# Patient Record
Sex: Female | Born: 1970 | Race: White | Hispanic: No | Smoking: Current some day smoker
Health system: Southern US, Community
[De-identification: ages and names within clinical notes are randomized; demographics above are authoritative.]

## PROBLEM LIST (undated history)

## (undated) DIAGNOSIS — B977 Papillomavirus as the cause of diseases classified elsewhere: Secondary | ICD-10-CM

## (undated) DIAGNOSIS — I10 Essential (primary) hypertension: Secondary | ICD-10-CM

## (undated) DIAGNOSIS — M797 Fibromyalgia: Secondary | ICD-10-CM

## (undated) DIAGNOSIS — G8929 Other chronic pain: Secondary | ICD-10-CM

## (undated) HISTORY — DX: Other chronic pain: G89.29

## (undated) HISTORY — PX: BUNIONECTOMY: SHX129

## (undated) HISTORY — DX: Papillomavirus as the cause of diseases classified elsewhere: B97.7

## (undated) HISTORY — PX: CARPAL TUNNEL RELEASE: SHX101

---

## 2001-03-17 ENCOUNTER — Ambulatory Visit (HOSPITAL_COMMUNITY): Admission: RE | Admit: 2001-03-17 | Discharge: 2001-03-17 | Payer: Self-pay | Admitting: Family Medicine

## 2001-03-17 ENCOUNTER — Encounter: Payer: Self-pay | Admitting: Family Medicine

## 2001-03-30 HISTORY — PX: COLPOSCOPY VULVA W/ BIOPSY: SUR282

## 2001-05-12 ENCOUNTER — Encounter: Payer: Self-pay | Admitting: Family Medicine

## 2001-05-12 ENCOUNTER — Ambulatory Visit (HOSPITAL_COMMUNITY): Admission: RE | Admit: 2001-05-12 | Discharge: 2001-05-12 | Payer: Self-pay | Admitting: Family Medicine

## 2001-11-03 ENCOUNTER — Other Ambulatory Visit: Admission: RE | Admit: 2001-11-03 | Discharge: 2001-11-03 | Payer: Self-pay | Admitting: *Deleted

## 2002-02-03 ENCOUNTER — Encounter: Payer: Self-pay | Admitting: Family Medicine

## 2002-02-03 ENCOUNTER — Ambulatory Visit (HOSPITAL_COMMUNITY): Admission: RE | Admit: 2002-02-03 | Discharge: 2002-02-03 | Payer: Self-pay | Admitting: Family Medicine

## 2002-04-11 ENCOUNTER — Ambulatory Visit (HOSPITAL_COMMUNITY): Admission: RE | Admit: 2002-04-11 | Discharge: 2002-04-11 | Payer: Self-pay | Admitting: Orthopaedic Surgery

## 2009-05-13 ENCOUNTER — Ambulatory Visit (HOSPITAL_COMMUNITY): Admission: RE | Admit: 2009-05-13 | Discharge: 2009-05-13 | Payer: Self-pay | Admitting: Family Medicine

## 2010-05-28 ENCOUNTER — Other Ambulatory Visit (HOSPITAL_COMMUNITY): Payer: Self-pay | Admitting: Family Medicine

## 2010-05-28 DIAGNOSIS — Z139 Encounter for screening, unspecified: Secondary | ICD-10-CM

## 2010-06-03 ENCOUNTER — Other Ambulatory Visit (HOSPITAL_COMMUNITY): Payer: Self-pay | Admitting: Family Medicine

## 2010-06-03 ENCOUNTER — Ambulatory Visit (HOSPITAL_COMMUNITY)
Admission: RE | Admit: 2010-06-03 | Discharge: 2010-06-03 | Disposition: A | Discharge status: Home / Self Care | Payer: BC Managed Care – PPO | Source: Ambulatory Visit | Attending: Family Medicine | Admitting: Family Medicine

## 2010-06-03 DIAGNOSIS — M25561 Pain in right knee: Secondary | ICD-10-CM

## 2010-06-03 DIAGNOSIS — Z1231 Encounter for screening mammogram for malignant neoplasm of breast: Secondary | ICD-10-CM | POA: Insufficient documentation

## 2010-06-03 DIAGNOSIS — Z139 Encounter for screening, unspecified: Secondary | ICD-10-CM

## 2010-08-15 NOTE — Op Note (Signed)
NAME:  [name], [name]                          ACCOUNT NO.:  [id number]   MEDICAL RECORD NO.:  [id number]                   PATIENT TYPE:  AMB   LOCATION:  DAY                                  FACILITY:  [facility]   PHYSICIAN:  J. Darreld Mclean, M.D.              DATE OF BIRTH:  [birth year]   DATE OF PROCEDURE:  DATE OF DISCHARGE:                                 OPERATIVE REPORT   PREOPERATIVE DIAGNOSIS:  Carpal tunnel syndrome right.   POSTOPERATIVE DIAGNOSIS:  Carpal tunnel syndrome right.   PROCEDURE:  1. Release of ulnar carpal tunnel ligament, saline neurolysis median nerve,     right.  2. Release carpal tunnel right.   SURGEON:  J. Darreld Mclean, M.D.   ASSISTANT:  Candace Cruise, P.A.C.   ANESTHESIA:  Bier block   TOURNIQUET TIME:  Please refer to anesthesia record for tourniquet time   DRAINS:  None.   SPLINT:  Volar plaster splint applied.   INDICATIONS:  This patient is a 40 year old female with pain and tenderness  in her right hand.  Nerve conduction EMGs show carpal tunnel syndrome  bilaterally.  Right hand hurts more than the left.  The risks and  imponderables were discussed with the patient preoperatively.  She appears  to understand and agreed to the procedure as outlined   DESCRIPTION OF PROCEDURE:  The patient was placed supine on the operating  room table and appropriate Bier block anesthesia was given.  The patient  prepped and draped in the usual manner.  A right paramedian incision was  made.  After careful dissection, the median nerve was identified proximally  and vessel loop placed around the nerve.  A grove retractor was then placed  in the carpal tunnel space and the volar carpal ligament was then incised.  There was obvious compression to the median nerve.  Specimen of volar carpal  ligament sent to pathology.  Retinaculum cut proximally and at the skin.   Saline neurolysis aponeurotomy carried out. Nerve was inspected, no apparent  injury.  The  wound was reapproximated with 3-0 Nylon in interrupted vertical  mattress manner.  Sterile dressing applied.  Sheet cotton applied.  Sheet  cotton cut dorsally.  Volar plaster splint applied.  Ace bandage applied  loosely.  Tourniquet deflated  in the usual fashion after Bier block anesthesia.  The patient given  appropriate analgesia for pain.  I will see the patient in the office in  approximately 10 days to 2 weeks; if any difficulty, she is to contact me at  the office or the hospital beeper system.  Numbers have been provided.                                               Teola Bradley, M.D.  JWK/MEDQ  D:  04/11/2002  T:  04/11/2002  Job:  [id number]

## 2010-08-15 NOTE — H&P (Signed)
NAMEALANNIS, [name]                            ACCOUNT NO.:  [id number]   MEDICAL RECORD NO.:  [id number]                  PATIENT TYPE:   LOCATION:                                       FACILITY:   PHYSICIAN:  J. Darreld Mclean, M.D.              DATE OF BIRTH:   DATE OF ADMISSION:  DATE OF DISCHARGE:                                HISTORY & PHYSICAL   CHIEF COMPLAINT:  Carpal tunnel syndrome.   HISTORY OF PRESENT ILLNESS:  The patient is a 40 year old female with  significant pain and tenderness in both hands that had been present for  several months.  She has had chronic neck pain and hand pain.  I first saw  her July 18 after Dr. Gerda Diss asked [place] to see her.  By that time she had  carpal tunnel syndrome bilaterally as well as chronic neck pain.  She  underwent EMG with nerve conduction velocities by Dr. Neale Burly at [facility] showing an abnormal study with electrophysiological evidence  of bilateral median nerve mononeuropathies consistent with bilateral carpal  tunnel syndrome.  The patient was advised of the findings and had splints,  rest, anti-inflammatories, been out of work, and she still has the problem.  We had her out of work for several months.  She went back to work; the pain  has reoccurred.  It is fairly significant, rather severe, and she wants to  go ahead and have the surgery done.  She wants to have the right hand done  first because that hurts her the most at the present time.  She will have  the right hand done first and then see how she does before she considers  doing the left hand.   There is no history of trauma to the hand and wrist and imponderables have  been discussed with the patient preoperatively.  She appears to understand  the procedure.   PAST MEDICAL HISTORY:  The patient has a history of hypertension.  Denies  any other significant medical problems.   ALLERGIES:  NAPROSYN and LODINE.   CURRENT MEDICATIONS:  1. Flexeril 10  mg daily.  2. Xanax 0.5 mg one to two daily.  3. Vicodin 5/500 three to four times a day as needed for pain.   SOCIAL HISTORY:  She smokes.  She does not use alcoholic beverages.  She has  her GED degree.   PRIMARY CARE PHYSICIAN:  Dr. Lubertha South is her family doctor.   PREVIOUS SURGERY:  Includes foot surgery in February 2003; breast surgery  2002; another foot surgery in 2001.   FAMILY HISTORY:  Her mother and father both have heart problems; she has  none.   PHYSICAL EXAMINATION:  VITAL SIGNS:  Blood pressure 112/64, pulse 64,  respirations 18, afebrile.  Height 5 feet 2 inches, weight 110.  GENERAL:  She is alert, cooperative, and oriented.  HEENT:  Negative.  NECK:  Supple.  LUNGS:  Clear to P&A.  HEART:  Regular rhythm without murmur.  ABDOMEN:  Soft, nontender, without masses.  EXTREMITIES:  Positive Phalen's, positive Tinel's both hands - right more  painful than the left.  NEUROLOGIC:  No deficits noted.  SKIN:  Intact.   IMPRESSION:  Bilateral carpal tunnel syndrome, right greater than left.   PLAN:  Carpal tunnel release on the right.  Again, I discussed risk and  imponderables with the patient who understands she may not get complete  immediate return of all sensation to her fingers, but should decrease her  pain.  Bier block anesthesia was discussed.  This is an outpatient  procedure; labs are pending.                                               Teola Bradley, M.D.    JWK/MEDQ  D:  04/06/2002  T:  04/06/2002  Job:  [id number]

## 2012-06-20 ENCOUNTER — Encounter: Payer: Self-pay | Admitting: Nurse Practitioner

## 2012-06-20 ENCOUNTER — Ambulatory Visit: Payer: Self-pay | Admitting: Nurse Practitioner

## 2012-06-20 ENCOUNTER — Encounter: Payer: Self-pay | Admitting: Family Medicine

## 2012-06-20 VITALS — BP 160/102 | HR 80 | Wt 134.0 lb

## 2012-06-20 DIAGNOSIS — T192XXA Foreign body in vulva and vagina, initial encounter: Secondary | ICD-10-CM

## 2012-06-20 DIAGNOSIS — N760 Acute vaginitis: Secondary | ICD-10-CM

## 2012-06-20 DIAGNOSIS — W448XXA Other foreign body entering into or through a natural orifice, initial encounter: Secondary | ICD-10-CM | POA: Insufficient documentation

## 2012-06-20 MED ORDER — DOXYCYCLINE HYCLATE 100 MG PO TBEC: 100.0000 mg | DELAYED_RELEASE_TABLET | Freq: Two times a day (BID) | ORAL | Status: DC

## 2012-06-20 MED ORDER — CEFTRIAXONE SODIUM 1 G IJ SOLR
250.0000 mg | Freq: Once | INTRAMUSCULAR | Status: AC
  Administered 2012-06-20: 250 mg via INTRAMUSCULAR

## 2012-06-20 MED ORDER — METRONIDAZOLE 500 MG PO TABS: ORAL_TABLET | ORAL | Status: AC

## 2012-06-20 MED ORDER — CIPROFLOXACIN HCL 500 MG PO TABS: 500.0000 mg | ORAL_TABLET | Freq: Two times a day (BID) | ORAL | Status: DC

## 2012-06-20 NOTE — Assessment & Plan Note (Addendum)
Assessment: retained tampon removed with secondary vaginitis  Plan: Rocephin 250 mg IM now.  Prescribed doxycycline and Flagyl.     Call back if any fever, rash, swelling, redness of the skin or pelvic pain.   Call back in 72 hours if no improvement, sooner if worse. Check BP outside office.  Recommend OV in near future for recheck.

## 2012-06-20 NOTE — Progress Notes (Signed)
Subjective:  Presents for c/o bad vaginal odor for past several days.  Her sexual partner noticed a foreign body in vagina during intercourse this weekend.  Thinks tampon has been in there for about a week.  No fever, rash, erythema or pelvic pain.  The only symptom she has noted is odor.  Objective:  NAD. Alert, oriented.  Lungs clear.  Heart RRR.  Abd soft, nontender. EGBUS nl. Vagina:  Moderate amt. Brown foul smelling discharge.  Tampon removed from area of lower cervix.  Wedged in under cervix.  No CMT.  Bimanuel exam nl.

## 2012-06-20 NOTE — Patient Instructions (Addendum)
You should have 2 antibiotics at pharmacy.  Doxycyline and Metronidazole.  Call back if any fever, rash, swelling, redness of the skin or pelvic pain.

## 2012-07-13 ENCOUNTER — Encounter: Payer: Self-pay | Admitting: *Deleted

## 2012-07-15 ENCOUNTER — Ambulatory Visit (INDEPENDENT_AMBULATORY_CARE_PROVIDER_SITE_OTHER): Payer: BC Managed Care – PPO | Admitting: Nurse Practitioner

## 2012-07-15 ENCOUNTER — Encounter: Payer: Self-pay | Admitting: Nurse Practitioner

## 2012-07-15 VITALS — BP 178/108 | HR 70 | Ht 60.0 in | Wt 137.4 lb

## 2012-07-15 DIAGNOSIS — I10 Essential (primary) hypertension: Secondary | ICD-10-CM

## 2012-07-15 DIAGNOSIS — R5383 Other fatigue: Secondary | ICD-10-CM

## 2012-07-15 DIAGNOSIS — Z13220 Encounter for screening for lipoid disorders: Secondary | ICD-10-CM

## 2012-07-15 DIAGNOSIS — R5381 Other malaise: Secondary | ICD-10-CM

## 2012-07-15 MED ORDER — LISINOPRIL-HYDROCHLOROTHIAZIDE 10-12.5 MG PO TABS: 1.0000 | ORAL_TABLET | Freq: Every day | ORAL | Status: DC

## 2012-07-15 NOTE — Patient Instructions (Signed)

## 2012-07-17 ENCOUNTER — Encounter: Payer: Self-pay | Admitting: Nurse Practitioner

## 2012-07-17 DIAGNOSIS — I10 Essential (primary) hypertension: Secondary | ICD-10-CM | POA: Insufficient documentation

## 2012-07-17 NOTE — Progress Notes (Signed)
Subjective:  Presents for complaints of elevated blood pressure for the past couple of months. 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 Her recent ECG in [place] which was normal. High stress job. Otherwise stress level has been normal. Has an active job requiring a lot of walking. Does have some slight swelling in her legs at the end of her shift. No orthopnea. No chest pain/ischemic type pain or shortness of breath. No OTC meds or supplements. Does have extra sodium in her diet. Some fatigue. Strong family history of hypertension. Patient stopped smoking several months ago.  Objective:   BP 178/108  Pulse 70  Ht 5' (1.524 m)  Wt 137 lb 6.4 oz (62.324 kg)  BMI 26.83 kg/m2  LMP 06/15/2012 NAD. Alert, oriented. Lungs clear. Heart regular rate rhythm. No murmur or gallop noted. BP on recheck left arm sitting 178/108. Lower extremities no edema.   Assessment: #1 Hypertension new diagnosis #2 fatigue  Plan: Zestoretic 10/12.5 daily. Discussed lifestyle factors affecting her pressure. Encourage stress reduction and low-sodium diet. Also regular exercise.  Meds ordered this encounter  Medications  . lisinopril-hydrochlorothiazide (PRINZIDE,ZESTORETIC) 10-12.5 MG per tablet    Sig: Take 1 tablet by mouth daily.    Dispense:  90 tablet    Refill:  1    Order Specific Question:  Supervising Provider    Answer:  Merlyn Albert [[id number]]   Orders Placed This Encounter  Procedures  . CBC with Differential    Standing Status: Future     Number of Occurrences: 1     Standing Expiration Date: 07/15/2013  . Basic metabolic panel    Standing Status: Future     Number of Occurrences: 1     Standing Expiration Date: 07/15/2013    Order Specific Question:  Has the patient fasted?    Answer:  Yes  . Hepatic function panel    Standing Status: Future     Number of Occurrences: 1     Standing Expiration Date: 07/15/2013  . Lipid panel    Standing  Status: Future     Number of Occurrences: 1     Standing Expiration Date: 07/15/2013    Order Specific Question:  Has the patient fasted?    Answer:  Yes  . TSH    Standing Status: Future     Number of Occurrences: 1     Standing Expiration Date: 07/15/2013  . Vitamin D 25 hydroxy    Standing Status: Future     Number of Occurrences: 1     Standing Expiration Date: 07/15/2013   Recheck in one month, call back sooner if any problems. Reviewed potential problems associated with uncontrolled hypertension.

## 2012-07-17 NOTE — Assessment & Plan Note (Signed)
Meds ordered this encounter  Medications  . lisinopril-hydrochlorothiazide (PRINZIDE,ZESTORETIC) 10-12.5 MG per tablet    Sig: Take 1 tablet by mouth daily.    Dispense:  90 tablet    Refill:  1    Order Specific Question:  Supervising Provider    Answer:  Merlyn Albert [[id number]]   Orders Placed This Encounter  Procedures  . CBC with Differential    Standing Status: Future     Number of Occurrences: 1     Standing Expiration Date: 07/15/2013  . Basic metabolic panel    Standing Status: Future     Number of Occurrences: 1     Standing Expiration Date: 07/15/2013    Order Specific Question:  Has the patient fasted?    Answer:  Yes  . Hepatic function panel    Standing Status: Future     Number of Occurrences: 1     Standing Expiration Date: 07/15/2013  . Lipid panel    Standing Status: Future     Number of Occurrences: 1     Standing Expiration Date: 07/15/2013    Order Specific Question:  Has the patient fasted?    Answer:  Yes  . TSH    Standing Status: Future     Number of Occurrences: 1     Standing Expiration Date: 07/15/2013  . Vitamin D 25 hydroxy    Standing Status: Future     Number of Occurrences: 1     Standing Expiration Date: 07/15/2013

## 2012-07-18 ENCOUNTER — Ambulatory Visit: Payer: Self-pay | Admitting: Nurse Practitioner

## 2012-07-22 LAB — CBC WITH DIFFERENTIAL/PLATELET
Basophils Absolute: 0 10*3/uL (ref 0.0–0.1)
Basophils Relative: 0 % (ref 0–1)
Eosinophils Absolute: 0.5 10*3/uL (ref 0.0–0.7)
Eosinophils Relative: 7 % — ABNORMAL HIGH (ref 0–5)
HCT: 36.6 % (ref 36.0–46.0)
Hemoglobin: 12.7 g/dL (ref 12.0–15.0)
Lymphocytes Relative: 26 % (ref 12–46)
Lymphs Abs: 1.8 10*3/uL (ref 0.7–4.0)
MCH: 29.9 pg (ref 26.0–34.0)
MCHC: 34.7 g/dL (ref 30.0–36.0)
MCV: 86.1 fL (ref 78.0–100.0)
Monocytes Absolute: 0.6 10*3/uL (ref 0.1–1.0)
Monocytes Relative: 8 % (ref 3–12)
Neutro Abs: 4.2 10*3/uL (ref 1.7–7.7)
Neutrophils Relative %: 59 % (ref 43–77)
Platelets: 279 10*3/uL (ref 150–400)
RBC: 4.25 MIL/uL (ref 3.87–5.11)
RDW: 13.3 % (ref 11.5–15.5)
WBC: 7 10*3/uL (ref 4.0–10.5)

## 2012-07-22 LAB — LIPID PANEL
Cholesterol: 195 mg/dL (ref 0–200)
HDL: 61 mg/dL (ref >39)
LDL Cholesterol: 101 mg/dL — ABNORMAL HIGH (ref 0–99)
Total CHOL/HDL Ratio: 3.2 ratio
Triglycerides: 164 mg/dL — ABNORMAL HIGH (ref <150)
VLDL: 33 mg/dL (ref 0–40)

## 2012-07-22 LAB — HEPATIC FUNCTION PANEL
ALT: 16 U/L (ref 0–35)
AST: 20 U/L (ref 0–37)
Albumin: 3.7 g/dL (ref 3.5–5.2)
Alkaline Phosphatase: 39 U/L (ref 39–117)
Bilirubin, Direct: 0.1 mg/dL (ref 0.0–0.3)
Indirect Bilirubin: 0.3 mg/dL (ref 0.0–0.9)
Total Bilirubin: 0.4 mg/dL (ref 0.3–1.2)
Total Protein: 6.3 g/dL (ref 6.0–8.3)

## 2012-07-22 LAB — TSH: TSH: 1.129 u[IU]/mL (ref 0.350–4.500)

## 2012-07-22 LAB — BASIC METABOLIC PANEL WITH GFR
BUN: 18 mg/dL (ref 6–23)
CO2: 27 meq/L (ref 19–32)
Calcium: 9.3 mg/dL (ref 8.4–10.5)
Chloride: 103 meq/L (ref 96–112)
Creat: 0.73 mg/dL (ref 0.50–1.10)
Glucose, Bld: 88 mg/dL (ref 70–99)
Potassium: 4.6 meq/L (ref 3.5–5.3)
Sodium: 137 meq/L (ref 135–145)

## 2012-07-23 LAB — VITAMIN D 25 HYDROXY (VIT D DEFICIENCY, FRACTURES): Vit D, 25-Hydroxy: 65 ng/mL (ref 30–89)

## 2012-07-26 ENCOUNTER — Encounter: Payer: Self-pay | Admitting: Nurse Practitioner

## 2012-08-05 ENCOUNTER — Ambulatory Visit: Payer: BC Managed Care – PPO | Admitting: Nurse Practitioner

## 2012-10-11 ENCOUNTER — Telehealth: Payer: Self-pay | Admitting: Family Medicine

## 2012-10-11 MED ORDER — LISINOPRIL-HYDROCHLOROTHIAZIDE 10-12.5 MG PO TABS: 1.0000 | ORAL_TABLET | Freq: Every day | ORAL | Status: DC

## 2012-10-11 NOTE — Telephone Encounter (Signed)
Rx sent electronically to [facility]. Patient notified.

## 2012-10-11 NOTE — Telephone Encounter (Signed)
Patient needs a refill of her lisinopril to [facility] in [place]. She will be out on Thursday, but has med check on Monday October 17, 2012

## 2012-10-17 ENCOUNTER — Ambulatory Visit (INDEPENDENT_AMBULATORY_CARE_PROVIDER_SITE_OTHER): Payer: BC Managed Care – PPO | Admitting: Nurse Practitioner

## 2012-10-17 VITALS — BP 114/86 | HR 70 | Wt 135.4 lb

## 2012-10-17 DIAGNOSIS — I10 Essential (primary) hypertension: Secondary | ICD-10-CM

## 2012-10-17 DIAGNOSIS — J322 Chronic ethmoidal sinusitis: Secondary | ICD-10-CM

## 2012-10-17 MED ORDER — AMOXICILLIN-POT CLAVULANATE 875-125 MG PO TABS: 1.0000 | ORAL_TABLET | Freq: Two times a day (BID) | ORAL | Status: DC

## 2012-10-17 NOTE — Patient Instructions (Addendum)
Nasacort AQ as directed Claritin or Allegra as directed

## 2012-10-18 ENCOUNTER — Encounter: Payer: Self-pay | Admitting: Nurse Practitioner

## 2012-10-18 DIAGNOSIS — F1911 Other psychoactive substance abuse, in remission: Secondary | ICD-10-CM | POA: Insufficient documentation

## 2012-10-18 NOTE — Assessment & Plan Note (Signed)
Patient continues to maintain her sobriety after several months.

## 2012-10-18 NOTE — Assessment & Plan Note (Signed)
Continue Zestoretic 10/12.5 daily.

## 2012-10-18 NOTE — Progress Notes (Signed)
Subjective:  Presents for routine followup on her blood pressure. Compliant with medications. No chest pain shortness of breath or edema. Continues to maintain her sobriety. Doing well emotionally. Over the past several days has developed a ethmoid sinus area headache. No cough. Runny nose. Has been moving to another house and exposed to a lot of dust. No ear pain or sore throat. No wheezing.  Objective:   BP 114/86  Pulse 70  Wt 135 lb 6.4 oz (61.417 kg)  BMI 26.44 kg/m2 NAD. Alert, oriented. Cheerful affect. TMs clear effusion, no erythema. Pharynx injected with green PND noted. Neck supple with mild soft nontender adenopathy. Lungs clear. Heart regular rate rhythm. Lower extremities no edema.  Assessment:Essential hypertension, benign  Ethmoid sinusitis  Meds ordered this encounter  Medications  . amoxicillin-clavulanate (AUGMENTIN) 875-125 MG per tablet    Sig: Take 1 tablet by mouth 2 (two) times daily.    Dispense:  20 tablet    Refill:  0    Order Specific Question:  Supervising Provider    Answer:  Merlyn Albert [[id number]]   Plain Allegra or Claritin as directed. Nasacort AQ as directed. Call back if symptoms worsen or persist. Continue Zestoretic 10/12.5 daily. Routine followup in 6 months, recommend preventive health physical.

## 2012-11-14 ENCOUNTER — Telehealth: Payer: Self-pay | Admitting: Family Medicine

## 2012-11-14 ENCOUNTER — Other Ambulatory Visit: Payer: Self-pay | Admitting: Nurse Practitioner

## 2012-11-14 MED ORDER — LISINOPRIL-HYDROCHLOROTHIAZIDE 10-12.5 MG PO TABS: 1.0000 | ORAL_TABLET | Freq: Every day | ORAL | Status: DC

## 2012-11-14 NOTE — Telephone Encounter (Signed)
Patient says she lost her prescriptions for lisinopril and she would like a 3 month supply called in    [facility] in [place]

## 2013-02-03 ENCOUNTER — Ambulatory Visit (INDEPENDENT_AMBULATORY_CARE_PROVIDER_SITE_OTHER): Payer: BC Managed Care – PPO | Admitting: Family Medicine

## 2013-02-03 ENCOUNTER — Encounter: Payer: Self-pay | Admitting: Family Medicine

## 2013-02-03 VITALS — BP 130/84 | Ht 60.0 in | Wt 144.0 lb

## 2013-02-03 DIAGNOSIS — R21 Rash and other nonspecific skin eruption: Secondary | ICD-10-CM

## 2013-02-03 MED ORDER — DOXYCYCLINE HYCLATE 100 MG PO TABS: 100.0000 mg | ORAL_TABLET | Freq: Two times a day (BID) | ORAL | Status: DC

## 2013-02-03 NOTE — Progress Notes (Signed)
  Subjective:    Patient ID: [name], female    DOB: 1970/10/20, 42 y.o.   MRN: [id number]  HPI Patient was originally scheduled for a boil under her left nostril, but now it seems to be healing. She drained it the other night.    She would like to focus more on her methadone. She states she is tapering off of the medication and she would like another medication to help her in case she starts having withdrawals.  Put camphoph, and other topical agents.  Hx of cold sores,  Goes to the methadone clinic--goesw once every two weeks  Started higher and now dropping, pt has increased the wean quicker on her own,  Has trouble seeing the dr for the methadone clinic...      Review of Systems No fever or chills history of similar lesion on left nostril    Objective:   Physical Exam  Alert no apparent distress. Lungs clear. Heart regular in rhythm. HEENT left lateral nostril pustule noted tender neck supple lungs clear. Heart regular in rhythm.      Assessment & Plan:  Impression secondarily infected herpetic lesion. 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 I educated her that she needs to maintain with her specialist to do this plan Doxy twice a day 10 days. Get back with a pain specialist soon. WSL

## 2013-10-19 ENCOUNTER — Encounter: Payer: Self-pay | Admitting: Nurse Practitioner

## 2013-10-19 ENCOUNTER — Ambulatory Visit (INDEPENDENT_AMBULATORY_CARE_PROVIDER_SITE_OTHER): Payer: BC Managed Care – PPO | Admitting: Nurse Practitioner

## 2013-10-19 VITALS — BP 118/70 | Resp 18 | Ht 60.0 in | Wt 121.0 lb

## 2013-10-19 DIAGNOSIS — Z0000 Encounter for general adult medical examination without abnormal findings: Secondary | ICD-10-CM

## 2013-10-19 DIAGNOSIS — Z01419 Encounter for gynecological examination (general) (routine) without abnormal findings: Secondary | ICD-10-CM

## 2013-10-19 DIAGNOSIS — Z9289 Personal history of other medical treatment: Secondary | ICD-10-CM

## 2013-10-19 DIAGNOSIS — Z124 Encounter for screening for malignant neoplasm of cervix: Secondary | ICD-10-CM

## 2013-10-19 DIAGNOSIS — Z9189 Other specified personal risk factors, not elsewhere classified: Secondary | ICD-10-CM

## 2013-10-19 MED ORDER — LISINOPRIL-HYDROCHLOROTHIAZIDE 10-12.5 MG PO TABS: 1.0000 | ORAL_TABLET | Freq: Every day | ORAL | Status: DC

## 2013-10-20 LAB — PAP IG W/ RFLX HPV ASCU

## 2013-10-23 ENCOUNTER — Ambulatory Visit (HOSPITAL_COMMUNITY)
Admission: RE | Admit: 2013-10-23 | Discharge: 2013-10-23 | Disposition: A | Discharge status: Home / Self Care | Payer: BC Managed Care – PPO | Source: Ambulatory Visit | Attending: Nurse Practitioner | Admitting: Nurse Practitioner

## 2013-10-23 ENCOUNTER — Encounter: Payer: Self-pay | Admitting: Nurse Practitioner

## 2013-10-23 ENCOUNTER — Ambulatory Visit (HOSPITAL_COMMUNITY): Payer: BC Managed Care – PPO

## 2013-10-23 DIAGNOSIS — Z01419 Encounter for gynecological examination (general) (routine) without abnormal findings: Secondary | ICD-10-CM

## 2013-10-23 DIAGNOSIS — Z0000 Encounter for general adult medical examination without abnormal findings: Secondary | ICD-10-CM | POA: Insufficient documentation

## 2013-10-23 DIAGNOSIS — Z9189 Other specified personal risk factors, not elsewhere classified: Secondary | ICD-10-CM | POA: Insufficient documentation

## 2013-10-23 DIAGNOSIS — Z9289 Personal history of other medical treatment: Secondary | ICD-10-CM

## 2013-10-23 NOTE — Progress Notes (Signed)
   Subjective:    Patient ID: Daisy ReichertGladys D Freeman, female    DOB: 03/21/1971, 43 y.o.   MRN: [id number]  HPI presents for her wellness checkup. Is due for an eye exam. Regular dental care.  Same sexual partner. Menses irregular, once a month light flow lasting about 3 days.    Review of Systems  Constitutional: Negative for fever, activity change, appetite change and fatigue.  HENT: Negative for dental problem, ear pain, sinus pressure and sore throat.   Respiratory: Negative for cough, chest tightness, shortness of breath and wheezing.   Cardiovascular: Negative for chest pain.  Gastrointestinal: Negative for nausea, vomiting, abdominal pain, diarrhea, constipation and abdominal distention.  Genitourinary: Negative for dysuria, urgency, frequency, vaginal discharge, enuresis, difficulty urinating, genital sores, menstrual problem and pelvic pain.       Objective:   Physical Exam  Vitals reviewed. Constitutional: She is oriented to person, place, and time. She appears well-developed. No distress.  HENT:  Right Ear: External ear normal.  Left Ear: External ear normal.  Mouth/Throat: Oropharynx is clear and moist.  Neck: Normal range of motion. Neck supple. No tracheal deviation present. No thyromegaly present.  Cardiovascular: Normal rate, regular rhythm and normal heart sounds.  Exam reveals no gallop.   No murmur heard. Pulmonary/Chest: Effort normal and breath sounds normal.  Abdominal: Soft. She exhibits no distension. There is no tenderness.  Genitourinary: Vagina normal and uterus normal. No vaginal discharge found.  External GU: No rashes or lesions. Vagina no discharge. Cervix normal limit in appearance, no CMT. Bimanual exam no tenderness or obvious masses.  Musculoskeletal: She exhibits no edema.  Lymphadenopathy:    She has no cervical adenopathy.  Neurological: She is alert and oriented to person, place, and time.  Skin: Skin is warm and dry. No rash noted.  Psychiatric: She  has a normal mood and affect. Her behavior is normal.   Breast exam: No dominant masses, axilla no adenopathy.        Assessment & Plan:  Well woman exam - Plan: MM DIGITAL SCREENING BILATERAL  Screening for cervical cancer - Plan: Pap IG w/ reflex to HPV when ASC-U  H/O screening mammography - Plan: MM DIGITAL SCREENING BILATERAL  Encouraged healthy diet and regular activity. Has done well with her weight loss. Also daily calcium and vitamin D. Return in about 1 year (around 10/20/2014).

## 2013-10-23 NOTE — Progress Notes (Signed)
Patient notified and verbalized understanding of the test results. No further questions. 

## 2013-12-01 ENCOUNTER — Other Ambulatory Visit: Payer: Self-pay | Admitting: Nurse Practitioner

## 2013-12-01 ENCOUNTER — Telehealth: Payer: Self-pay | Admitting: Family Medicine

## 2013-12-01 NOTE — Telephone Encounter (Signed)
Flushing can be from weaning off methadone; please schedule NV to verify BP; if increased can increase med then; her recent BPs have been good; if we increase without knowing BP, could cause dizziness and fainting.

## 2013-12-01 NOTE — Telephone Encounter (Signed)
Patient scheduled office visit for blood pressure check.

## 2013-12-01 NOTE — Telephone Encounter (Signed)
What is her latest BP?

## 2013-12-01 NOTE — Telephone Encounter (Signed)
Patient doesn't know what her blood pressure is running-hasn't checked/can't check but does have flushing feeling in face.

## 2013-12-01 NOTE — Telephone Encounter (Signed)
Patient is tapering down off of her methadone and she was told it was normal for her blood pressure to increase while she is doing this. She is requesting that we increase the dosage on her blood pressure. She said she has previously spoken with Eber Jones about this.  [facility]

## 2013-12-01 NOTE — Telephone Encounter (Signed)
Left message to return call 

## 2013-12-07 ENCOUNTER — Encounter: Payer: Self-pay | Admitting: Nurse Practitioner

## 2013-12-07 ENCOUNTER — Ambulatory Visit (INDEPENDENT_AMBULATORY_CARE_PROVIDER_SITE_OTHER): Payer: BC Managed Care – PPO | Admitting: Nurse Practitioner

## 2013-12-07 ENCOUNTER — Encounter: Payer: Self-pay | Admitting: Family Medicine

## 2013-12-07 VITALS — BP 112/78 | Ht 60.0 in | Wt 124.0 lb

## 2013-12-07 DIAGNOSIS — I10 Essential (primary) hypertension: Secondary | ICD-10-CM

## 2013-12-07 DIAGNOSIS — Z87898 Personal history of other specified conditions: Secondary | ICD-10-CM

## 2013-12-07 DIAGNOSIS — F1911 Other psychoactive substance abuse, in remission: Secondary | ICD-10-CM

## 2013-12-07 DIAGNOSIS — F1920 Other psychoactive substance dependence, uncomplicated: Secondary | ICD-10-CM

## 2013-12-07 DIAGNOSIS — F19939 Other psychoactive substance use, unspecified with withdrawal, unspecified: Secondary | ICD-10-CM

## 2013-12-07 MED ORDER — CLONIDINE HCL 0.1 MG PO TABS: 0.1000 mg | ORAL_TABLET | Freq: Two times a day (BID) | ORAL | Status: DC

## 2013-12-07 MED ORDER — CLONAZEPAM 0.5 MG PO TABS: ORAL_TABLET | ORAL | Status: DC

## 2013-12-09 ENCOUNTER — Encounter: Payer: Self-pay | Admitting: Nurse Practitioner

## 2013-12-09 NOTE — Progress Notes (Signed)
Subjective:  Presents for recheck of her blood pressure. Has been on methadone for the past 6 years through the same clinic. Over the past 2 years she has slowly weaned her dose down to 12 mg. Because her dose was so, patient felt she could stop methadone which she did abruptly 2 weeks ago. Began having immediate withdrawal which continues including agitation sweats and sleep disturbance. Has contacted the clinic, there policy is that she will need to restart daily visits which interferes with her job. Clinic opens at 6 AM, usually has a long line. She has to be at work at 7 AM. Out of desperation, patient has taken Suboxone 2 mg once a day when she can, has also taken half of a yellow Xanax twice a day on occasion. Patient has not taken any other drugs or alcohol. Is desperate to maintain her recovery. Denies suicidal or homicidal thoughts or ideation.    Objective:   BP 112/78  Ht 5' (1.524 m)  Wt 124 lb (56.246 kg)  BMI 24.22 kg/m2  LMP 12/07/2013  NAD. Alert, oriented. Agitated and moderately anxious. Crying at times shaking during office visit. Thoughts logical coherent and relevant. Dressed appropriately. Lungs clear. Heart regular rate rhythm.   Assessment:  Problem List Items Addressed This Visit     Cardiovascular and Mediastinum   Hypertension - Primary   Relevant Medications      cloNIDine (CATAPRES) tablet     Other   History of substance abuse (Chronic)    Other Visit Diagnoses   Withdrawal from other psychoactive substance           Plan: Consulted with Dr. Brett Canales about medications to help with withdrawal until patient can get back into a methadone clinic. Meds ordered this encounter  Medications  . cloNIDine (CATAPRES) 0.1 MG tablet    Sig: Take 1 tablet (0.1 mg total) by mouth 2 (two) times daily.    Dispense:  60 tablet    Refill:  0    Order Specific Question:  Supervising Provider    Answer:  Merlyn Albert [[id number]]  . clonazePAM (KLONOPIN) 0.5 MG tablet   Sig: 1/2 - 1 po BID prn anxiety    Dispense:  30 tablet    Refill:  0    Order Specific Question:  Supervising Provider    Answer:  Merlyn Albert [[id number]]   Use clonazepam sparingly. Stop all other meds unless prescribed. Initially patient was interested in trying to get into Dr. Ronal Fear clinic. Discussed at length her options, patient has a personal phone number for her counselor from the clinic. Strongly recommend that patient work with the clinic that she is been going to for years to restart her methadone treatment. Feel this will be a better option versus starting with a new clinic does not know her history. Also may take time to get her started in a new clinic. Recommend FMLA to help with work if needed. To call us back next week if we can be of assistance.

## 2013-12-11 ENCOUNTER — Telehealth: Payer: Self-pay | Admitting: Family Medicine

## 2013-12-11 DIAGNOSIS — G894 Chronic pain syndrome: Secondary | ICD-10-CM

## 2013-12-11 NOTE — Telephone Encounter (Signed)
See car note, let's do

## 2013-12-11 NOTE — Telephone Encounter (Signed)
Referral ordered in Epic. 

## 2013-12-11 NOTE — Telephone Encounter (Signed)
Patient would like to go ahead and have the referral to Dr. Harriett Sine.

## 2013-12-18 ENCOUNTER — Telehealth: Payer: Self-pay | Admitting: Family Medicine

## 2013-12-18 NOTE — Telephone Encounter (Signed)
Patient checking on referral to Dr. Gerilyn Pilgrim.

## 2013-12-20 NOTE — Telephone Encounter (Signed)
Referral sent, they will contact pt to schedule, LMOVM to notify pt

## 2014-04-18 ENCOUNTER — Ambulatory Visit: Payer: Self-pay | Admitting: Family Medicine

## 2014-04-18 ENCOUNTER — Encounter: Payer: Self-pay | Admitting: Family Medicine

## 2014-04-18 ENCOUNTER — Ambulatory Visit (INDEPENDENT_AMBULATORY_CARE_PROVIDER_SITE_OTHER): Payer: 59 | Admitting: Family Medicine

## 2014-04-18 VITALS — BP 130/80 | Ht 60.0 in | Wt 126.5 lb

## 2014-04-18 DIAGNOSIS — M7551 Bursitis of right shoulder: Secondary | ICD-10-CM

## 2014-04-18 MED ORDER — PREDNISONE 20 MG PO TABS: ORAL_TABLET | ORAL | Status: DC

## 2014-04-18 NOTE — Progress Notes (Signed)
   Subjective:    Patient ID: [name], female    DOB: 08/27/1970, 44 y.o.   MRN: [id number]  Shoulder Pain  The pain is present in the right shoulder. This is a new problem. The current episode started in the past 7 days. There has been no history of extremity trauma. The problem occurs constantly. The problem has been unchanged. The quality of the pain is described as aching. The pain is moderate. Associated symptoms include a limited range of motion. The symptoms are aggravated by activity. She has tried cold for the symptoms. The treatment provided moderate relief.   Patient states that she has no other concerns at this time.    started flaring Monday around lunch time  More ot than usual, works a cmachine with wipe packaging   Hx of right hand carpal tunnel syndrme  Left hand aching deep in left thumb  Pain deep in right deltoid region feels like a toothache. Doing overtime work currently.   Has been having issues of hand s going numb at times Review of Systems No headache no neck pain no chest pain no shortness of breath    Objective:   Physical Exam  Alert no acute distress right deltoid tenderness palpation. Positive difficulty with extension negative impingement sign      Assessment & Plan:  Impression deltoid bursitis plan Codman's exercises. Prednisone taper. Patient continues to remain under chronic pain control management. WSL

## 2014-07-06 ENCOUNTER — Other Ambulatory Visit: Payer: Self-pay | Admitting: Nurse Practitioner

## 2014-07-11 ENCOUNTER — Other Ambulatory Visit: Payer: Self-pay | Admitting: Nurse Practitioner

## 2014-07-18 ENCOUNTER — Telehealth: Payer: Self-pay | Admitting: Family Medicine

## 2014-07-18 NOTE — Telephone Encounter (Signed)
Sorry, but No, no, and no. There must be just one "caoptain of the ship" when it comes to narcotic addiction. This must be given thru her addiction specialist.

## 2014-07-18 NOTE — Telephone Encounter (Signed)
[name],   cloNIDine (CATAPRES) 0.1 MG tablet   Pt is still tapering off the methadone, down to  siboxin (?) but she is still having side affects from  Coming off the meds. She states you had given her  This med in the past to help deal with her BP issues  Can we refill and send to [facility]

## 2014-07-18 NOTE — Telephone Encounter (Signed)
Discussed with patient. Patient verbalized understanding. 

## 2014-08-13 ENCOUNTER — Ambulatory Visit (INDEPENDENT_AMBULATORY_CARE_PROVIDER_SITE_OTHER): Payer: 59 | Admitting: Family Medicine

## 2014-08-13 ENCOUNTER — Encounter: Payer: Self-pay | Admitting: Family Medicine

## 2014-08-13 VITALS — BP 128/84 | Ht 60.0 in | Wt 130.0 lb

## 2014-08-13 DIAGNOSIS — Z87898 Personal history of other specified conditions: Secondary | ICD-10-CM | POA: Diagnosis not present

## 2014-08-13 DIAGNOSIS — I10 Essential (primary) hypertension: Secondary | ICD-10-CM

## 2014-08-13 DIAGNOSIS — F1911 Other psychoactive substance abuse, in remission: Secondary | ICD-10-CM

## 2014-08-13 MED ORDER — LISINOPRIL-HYDROCHLOROTHIAZIDE 10-12.5 MG PO TABS: 1.0000 | ORAL_TABLET | Freq: Every day | ORAL | Status: DC

## 2014-08-13 NOTE — Progress Notes (Signed)
   Subjective:    Patient ID: [name], female    DOB: 07/16/1970, 44 y.o.   MRN: [id number]  Hypertension This is a chronic problem. The current episode started more than 1 year ago. Treatments tried: lisinopril/HCTZ.   Still working full time  Off the suboxone how, notes her back pain over all is better  Doing her best to stay away from narcotics.  Admits to salt intake excessive at times.  Work place is now working with the pt     Review of Systems No headache no chest pain no back pain decent appetite    Objective:   Physical Exam  Alert vitals stable blood pressure good on repeat HEENT normal. Lungs clear.      Assessment & Plan:  Impression hypertension good control #2 narcotic dependence discussed patient now off these agents plan maintain blood pressure medicine. Patient congratulated. Diet exercise discussed recheck in 6 months WSL

## 2014-08-20 ENCOUNTER — Ambulatory Visit: Payer: 59 | Admitting: Family Medicine

## 2015-02-13 ENCOUNTER — Encounter: Payer: Self-pay | Admitting: Family Medicine

## 2015-02-13 ENCOUNTER — Ambulatory Visit (INDEPENDENT_AMBULATORY_CARE_PROVIDER_SITE_OTHER): Payer: 59 | Admitting: Family Medicine

## 2015-02-13 VITALS — BP 128/82 | Ht 60.0 in | Wt 123.0 lb

## 2015-02-13 DIAGNOSIS — J310 Chronic rhinitis: Secondary | ICD-10-CM

## 2015-02-13 DIAGNOSIS — Z79899 Other long term (current) drug therapy: Secondary | ICD-10-CM | POA: Diagnosis not present

## 2015-02-13 DIAGNOSIS — I10 Essential (primary) hypertension: Secondary | ICD-10-CM

## 2015-02-13 DIAGNOSIS — J329 Chronic sinusitis, unspecified: Secondary | ICD-10-CM

## 2015-02-13 DIAGNOSIS — Z13220 Encounter for screening for lipoid disorders: Secondary | ICD-10-CM

## 2015-02-13 DIAGNOSIS — Z23 Encounter for immunization: Secondary | ICD-10-CM

## 2015-02-13 DIAGNOSIS — Z87898 Personal history of other specified conditions: Secondary | ICD-10-CM

## 2015-02-13 DIAGNOSIS — F1911 Other psychoactive substance abuse, in remission: Secondary | ICD-10-CM

## 2015-02-13 MED ORDER — AZITHROMYCIN 250 MG PO TABS: ORAL_TABLET | ORAL | Status: DC

## 2015-02-13 MED ORDER — LISINOPRIL-HYDROCHLOROTHIAZIDE 10-12.5 MG PO TABS: 1.0000 | ORAL_TABLET | Freq: Every day | ORAL | Status: DC

## 2015-02-13 NOTE — Progress Notes (Signed)
   Subjective:    Patient ID: [name], female    DOB: 10/13/1970, 44 y.o.   MRN: [id number]  Hypertension This is a new problem. The current episode started more than 1 year ago. Treatments tried: lisinopril hctz.   Patient states she went back to clinic and got back on methadone about a month ago. Patient currently going daily to clinic and receiving 60 mg of methadone a day.  Good back on the methadone clini c  Poss cts on left arm  BP generally good  , compliant with medication. Medicines reviewed today.    cough congestion drainage nasal stuffiness cough productive at times progressive 2 weeks duration does smoke cigarettes During day cts symptoms too   Review of Systems  no headache no chest pain no back pain ROS otherwise negative    Objective:   Physical Exam   alert mild malaise vitals stable HET moderate his congestion pharynx slight erythema lungs bronchial cough heart rate and rhythm.      Assessment & Plan:   impression 1 subacute rhinosinusitis/bronchitis #2 hypertension good control discussed meds reviewed #3 chronic methadone use discussed at length along with impact on family patient encouraged to stick with it. Plan appropriate blood work. Flu shot today. Antibiotics prescribed. Blood pressure medication refilled. Recheck in 6 months diet exercise discussed 25 minutes spent most in discussion WSL

## 2015-03-01 ENCOUNTER — Other Ambulatory Visit: Payer: Self-pay | Admitting: *Deleted

## 2015-03-01 ENCOUNTER — Telehealth: Payer: Self-pay | Admitting: Family Medicine

## 2015-03-01 MED ORDER — AMOXICILLIN-POT CLAVULANATE 875-125 MG PO TABS: 1.0000 | ORAL_TABLET | Freq: Two times a day (BID) | ORAL | Status: DC

## 2015-03-01 NOTE — Telephone Encounter (Signed)
Pt is requesting another antibiotic to be called in. Pt was recently seen and diagnosed with a sinus inf. Pt states that it has not cleared up.    [place] [facility]

## 2015-03-01 NOTE — Telephone Encounter (Signed)
Med sent to pharm. Pt notified on vm.  

## 2015-03-01 NOTE — Telephone Encounter (Signed)
Patient was seen on 02/13/15 diagnosed with rhinosinusitis/bronchitis. Prescribed Zpak.

## 2015-03-01 NOTE — Telephone Encounter (Signed)
Augmentin 875 one twice a day 10 days make sure to explained to the patient this is a strong antibiotic should take with the snack if doesn't clear up recommend follow-up

## 2015-03-11 ENCOUNTER — Other Ambulatory Visit: Payer: Self-pay | Admitting: Orthopaedic Surgery

## 2015-03-18 NOTE — Addendum Note (Signed)
Addended by: Darreld McleanKEELING, Bailea Beed on: 03/18/2015 08:52 AM   Modules accepted: Orders

## 2015-03-19 NOTE — Patient Instructions (Signed)
Daisy ReichertGladys D Freeman  03/19/2015     @PREFPERIOPPHARMACY @   Your procedure is scheduled on  03/28/2015  [facility]  [facility]  [facility].M.  Call this number if you have problems the morning of surgery:  [phone]   Remember:  Do not eat food or drink liquids after midnight.  Take these medicines the morning of surgery with A SIP OF WATER  Neurontin, lisinopril, methadone.   Do not wear jewelry, make-up or nail polish.  Do not wear lotions, powders, or perfumes.  You may wear deodorant.  Do not shave 48 hours prior to surgery.  Men may shave face and neck.  Do not bring valuables to the hospital.  [facility] is not responsible for any belongings or valuables.  Contacts, dentures or bridgework may not be worn into surgery.  Leave your suitcase in the car.  After surgery it may be brought to your room.  For patients admitted to the hospital, discharge time will be determined by your treatment team.  Patients discharged the day of surgery will not be allowed to drive home.   Name and phone number of your driver:   family Special instructions:  none  Please read over the following fact sheets that you were given. Pain Booklet, Coughing and Deep Breathing, Surgical Site Infection Prevention, Anesthesia Post-op Instructions and Care and Recovery After Surgery      Carpal Tunnel Release Carpal tunnel release is a surgical procedure to relieve numbness and pain in your hand that are caused by carpal tunnel syndrome. Your carpal tunnel is a narrow, hollow space in your wrist. It passes between your wrist bones and a band of connective tissue (transverse carpal ligament). The nerve that supplies most of your hand (median nerve) passes through this space, and so do the connections between your fingers and the muscles of your arm (tendons). Carpal tunnel syndrome makes this space swell and become narrow, and this causes pain and numbness. In carpal tunnel release surgery, a surgeon  cuts through the transverse carpal ligament to make more room in the carpal tunnel space. You may have this surgery if other types of treatment have not worked. LET [facility] PROVIDER KNOW ABOUT:  Any allergies you have.  All medicines you are taking, including vitamins, herbs, eye drops, creams, and over-the-counter medicines.  Previous problems you or members of your family have had with the use of anesthetics.  Any blood disorders you have.  Previous surgeries you have had.  Medical conditions you have. RISKS AND COMPLICATIONS Generally, this is a safe procedure. However, problems may occur, including:  Bleeding.  Infection.  Injury to the median nerve.  Need for additional surgery. BEFORE THE PROCEDURE  Ask your health care provider about:  Changing or stopping your regular medicines. This is especially important if you are taking diabetes medicines or blood thinners.  Taking medicines such as aspirin and ibuprofen. These medicines can thin your blood. Do not take these medicines before your procedure if your health care provider instructs you not to.  Do not eat or drink anything after midnight on the night before the procedure or as directed by your health care provider.  Plan to have someone take you home after the procedure. PROCEDURE  An IV tube may be inserted into a vein.  You will be given one of the following:  A medicine that numbs the wrist area (local anesthetic). You may also be given a medicine  to make you relax (sedative).  A medicine that makes you go to sleep (general anesthetic).  Your arm, hand, and wrist will be cleaned with a germ-killing solution (antiseptic).  Your surgeon will make a surgical cut (incision) over the palm side of your wrist. The surgeon will pull aside the skin of your wrist to expose the carpal tunnel space.  The surgeon will cut the transverse carpal ligament.  The edges of the incision will be closed with  stitches (sutures) or staples.  A bandage (dressing) will be placed over your wrist and wrapped around your hand and wrist. AFTER THE PROCEDURE  You may spend some time in a recovery area.  Your blood pressure, heart rate, breathing rate, and blood oxygen level will be monitored often until the medicines you were given have worn off.  You will likely have some pain. You will be given pain medicine.  You may need to wear a splint or a wrist brace over your dressing.   This information is not intended to replace advice given to you by your health care provider. Make sure you discuss any questions you have with your health care provider.   Document Released: 06/06/2003 Document Revised: 04/06/2014 Document Reviewed: 11/01/2013 Elsevier Interactive Patient Education 2016 Elsevier Inc. PATIENT INSTRUCTIONS POST-ANESTHESIA  IMMEDIATELY FOLLOWING SURGERY:  Do not drive or operate machinery for the first twenty four hours after surgery.  Do not make any important decisions for twenty four hours after surgery or while taking narcotic pain medications or sedatives.  If you develop intractable nausea and vomiting or a severe headache please notify your doctor immediately.  FOLLOW-UP:  Please make an appointment with your surgeon as instructed. You do not need to follow up with anesthesia unless specifically instructed to do so.  WOUND CARE INSTRUCTIONS (if applicable):  Keep a dry clean dressing on the anesthesia/puncture wound site if there is drainage.  Once the wound has quit draining you may leave it open to air.  Generally you should leave the bandage intact for twenty four hours unless there is drainage.  If the epidural site drains for more than 36-48 hours please call the anesthesia department.  QUESTIONS?:  Please feel free to call your physician or the hospital operator if you have any questions, and they will be happy to assist you.

## 2015-03-20 ENCOUNTER — Encounter (HOSPITAL_COMMUNITY)
Admission: RE | Admit: 2015-03-20 | Discharge: 2015-03-20 | Disposition: A | Discharge status: Home / Self Care | Payer: 59 | Source: Ambulatory Visit | Attending: Orthopaedic Surgery | Admitting: Orthopaedic Surgery

## 2015-03-20 ENCOUNTER — Encounter (HOSPITAL_COMMUNITY): Payer: Self-pay

## 2015-03-20 ENCOUNTER — Other Ambulatory Visit: Payer: Self-pay

## 2015-03-20 DIAGNOSIS — Z01818 Encounter for other preprocedural examination: Secondary | ICD-10-CM | POA: Diagnosis present

## 2015-03-20 DIAGNOSIS — G5602 Carpal tunnel syndrome, left upper limb: Secondary | ICD-10-CM | POA: Insufficient documentation

## 2015-03-20 DIAGNOSIS — Z01812 Encounter for preprocedural laboratory examination: Secondary | ICD-10-CM | POA: Insufficient documentation

## 2015-03-20 HISTORY — DX: Essential (primary) hypertension: I10

## 2015-03-20 HISTORY — DX: Fibromyalgia: M79.7

## 2015-03-20 LAB — CBC WITH DIFFERENTIAL/PLATELET
Basophils Absolute: 0 10*3/uL (ref 0.0–0.1)
Basophils Relative: 1 %
Eosinophils Absolute: 0.4 10*3/uL (ref 0.0–0.7)
Eosinophils Relative: 6 %
HCT: 37.8 % (ref 36.0–46.0)
Hemoglobin: 13 g/dL (ref 12.0–15.0)
Lymphocytes Relative: 30 %
Lymphs Abs: 1.8 10*3/uL (ref 0.7–4.0)
MCH: 31.1 pg (ref 26.0–34.0)
MCHC: 34.4 g/dL (ref 30.0–36.0)
MCV: 90.4 fL (ref 78.0–100.0)
Monocytes Absolute: 0.5 10*3/uL (ref 0.1–1.0)
Monocytes Relative: 9 %
Neutro Abs: 3.2 10*3/uL (ref 1.7–7.7)
Neutrophils Relative %: 54 %
Platelets: 251 10*3/uL (ref 150–400)
RBC: 4.18 MIL/uL (ref 3.87–5.11)
RDW: 12.4 % (ref 11.5–15.5)
WBC: 6 10*3/uL (ref 4.0–10.5)

## 2015-03-20 LAB — COMPREHENSIVE METABOLIC PANEL WITH GFR
ALT: 48 U/L (ref 14–54)
AST: 38 U/L (ref 15–41)
Albumin: 4 g/dL (ref 3.5–5.0)
Alkaline Phosphatase: 102 U/L (ref 38–126)
Anion gap: 8 (ref 5–15)
BUN: 19 mg/dL (ref 6–20)
CO2: 26 mmol/L (ref 22–32)
Calcium: 9.4 mg/dL (ref 8.9–10.3)
Chloride: 101 mmol/L (ref 101–111)
Creatinine, Ser: 0.67 mg/dL (ref 0.44–1.00)
GFR calc Af Amer: >60 mL/min (ref >60)
GFR calc non Af Amer: >60 mL/min (ref >60)
Glucose, Bld: 92 mg/dL (ref 65–99)
Potassium: 4 mmol/L (ref 3.5–5.1)
Sodium: 135 mmol/L (ref 135–145)
Total Bilirubin: 0.4 mg/dL (ref 0.3–1.2)
Total Protein: 6.5 g/dL (ref 6.5–8.1)

## 2015-03-20 NOTE — Pre-Procedure Instructions (Signed)
Patient given information to sign up for my chart at home. 

## 2015-03-27 NOTE — H&P (Signed)
[name] is an 44 y.o. female.   Chief Complaint: Carpal tunnel syndrome left  HPI: She has about a five to six month history of increasing nocturnal numbness of the left hand in the median nerve distribution without good success with conservative treatments.  She had positive EMG on 03/04/15 by Dr. [place] showing moderate carpal tunnel syndrome without denervation or polyneuropathy.  I have recommended carpal tunnel release as an elective outpatient procedure.  I have gone over the risks and imponderables with her.  She asked appropriate questions.  She appears to understand.  She has history of substance abuse and is on Methadone.   Post op medicine will be limited and managed by her pain management physician.  Past Medical History  Diagnosis Date  . Chronic pain   . High risk HPV infection   . Hypertension   . Fibromyalgia     Past Surgical History  Procedure Laterality Date  . Colposcopy vulva w/ biopsy  2003    Negative biopsy  . Carpal tunnel release Right   . Bunionectomy Left     Family History  Problem Relation Age of Onset  . Heart attack Mother 40    2 heart attacks  . Hypertension Mother   . COPD Mother   . Heart disease Mother   . Heart attack Father 5150    died of MI  . Hypertension Father   . Alcohol abuse Father   . COPD Father   . Heart disease Father   . Hypertension Brother   . Hypertension Paternal Grandmother    Social History:  reports that she has been smoking Pipe and Cigarettes.  She has a 5 pack-year smoking history. She has never used smokeless tobacco. She reports that she does not drink alcohol or use illicit drugs.  Allergies: No Known Allergies  No prescriptions prior to admission    No results found for this or any previous visit (from the past 48 hour(s)). No results found.  Review of Systems  Cardiovascular:       History of hypertension  Musculoskeletal: Positive for joint pain (Noctural pain and decreased  sensation of median nerve distribution for several months.).  Psychiatric/Behavioral: Positive for substance abuse.       History of substance abuse, on methadone    There were no vitals taken for this visit. Physical Exam  Constitutional: She is oriented to person, place, and time. She appears well-developed and well-nourished.  HENT:  Head: Normocephalic and atraumatic.  Eyes: Conjunctivae and EOM are normal. Pupils are equal, round, and reactive to light.  Neck: Normal range of motion. Neck supple.  Cardiovascular: Normal rate, regular rhythm, normal heart sounds and intact distal pulses.   Respiratory: Effort normal and breath sounds normal.  GI: Soft.  Musculoskeletal: She exhibits tenderness (decreased sensation left median nerve distribution, positive Phalen and Tinel signs  No redness, no swelling).  Neurological: She is alert and oriented to person, place, and time. She has normal reflexes.  Skin: Skin is warm and dry.  Psychiatric: She has a normal mood and affect. Her behavior is normal. Judgment and thought content normal.     Assessment/Plan Carpal Tunnel Syndrome, left.  For elective outpatient surgery and release. History of substance abuse History of hypertension  Arlenne Kimbley 03/27/2015, 4:03 PM

## 2015-03-28 ENCOUNTER — Ambulatory Visit (HOSPITAL_COMMUNITY)
Admission: RE | Admit: 2015-03-28 | Discharge: 2015-03-28 | Disposition: A | Discharge status: Home / Self Care | Payer: 59 | Source: Ambulatory Visit | Attending: Orthopaedic Surgery | Admitting: Orthopaedic Surgery

## 2015-03-28 ENCOUNTER — Encounter (HOSPITAL_COMMUNITY)
Admission: RE | Disposition: A | Discharge status: Home / Self Care | Payer: Self-pay | Source: Ambulatory Visit | Attending: Orthopaedic Surgery

## 2015-03-28 ENCOUNTER — Ambulatory Visit (HOSPITAL_COMMUNITY): Payer: 59 | Admitting: Anesthesiology

## 2015-03-28 ENCOUNTER — Encounter (HOSPITAL_COMMUNITY): Payer: Self-pay | Admitting: *Deleted

## 2015-03-28 DIAGNOSIS — F17210 Nicotine dependence, cigarettes, uncomplicated: Secondary | ICD-10-CM | POA: Insufficient documentation

## 2015-03-28 DIAGNOSIS — G5602 Carpal tunnel syndrome, left upper limb: Secondary | ICD-10-CM | POA: Diagnosis present

## 2015-03-28 DIAGNOSIS — M797 Fibromyalgia: Secondary | ICD-10-CM | POA: Insufficient documentation

## 2015-03-28 DIAGNOSIS — I10 Essential (primary) hypertension: Secondary | ICD-10-CM | POA: Insufficient documentation

## 2015-03-28 HISTORY — PX: CARPAL TUNNEL RELEASE: SHX101

## 2015-03-28 LAB — PREGNANCY, URINE: Preg Test, Ur: NEGATIVE

## 2015-03-28 SURGERY — CARPAL TUNNEL RELEASE
Anesthesia: Regional | Laterality: Left

## 2015-03-28 MED ORDER — PROPOFOL 500 MG/50ML IV EMUL
INTRAVENOUS | Status: DC | PRN
Start: 2015-03-28 — End: 2015-03-28
  Administered 2015-03-28: 40 ug/kg/min via INTRAVENOUS

## 2015-03-28 MED ORDER — ONDANSETRON HCL 4 MG/2ML IJ SOLN
4.0000 mg | Freq: Once | INTRAMUSCULAR | Status: AC
  Administered 2015-03-28: 4 mg via INTRAVENOUS

## 2015-03-28 MED ORDER — PROPOFOL 10 MG/ML IV BOLUS
INTRAVENOUS | Status: DC | PRN
  Administered 2015-03-28: 10 mg via INTRAVENOUS

## 2015-03-28 MED ORDER — LIDOCAINE HCL (PF) 0.5 % IJ SOLN
INTRAMUSCULAR | Status: AC
  Filled 2015-03-28: qty 50

## 2015-03-28 MED ORDER — FENTANYL CITRATE (PF) 100 MCG/2ML IJ SOLN
INTRAMUSCULAR | Status: AC
  Filled 2015-03-28: qty 2

## 2015-03-28 MED ORDER — MIDAZOLAM HCL 5 MG/5ML IJ SOLN
INTRAMUSCULAR | Status: DC | PRN
  Administered 2015-03-28 (×2): 1 mg via INTRAVENOUS

## 2015-03-28 MED ORDER — FENTANYL CITRATE (PF) 100 MCG/2ML IJ SOLN
25.0000 ug | INTRAMUSCULAR | Status: DC | PRN
  Administered 2015-03-28 (×2): 50 ug via INTRAVENOUS
  Filled 2015-03-28: qty 2

## 2015-03-28 MED ORDER — ONDANSETRON HCL 4 MG/2ML IJ SOLN
INTRAMUSCULAR | Status: AC
  Filled 2015-03-28: qty 2

## 2015-03-28 MED ORDER — ONDANSETRON HCL 4 MG/2ML IJ SOLN: 4.0000 mg | Freq: Once | INTRAMUSCULAR | Status: DC | PRN

## 2015-03-28 MED ORDER — HYDROCODONE-ACETAMINOPHEN 7.5-325 MG PO TABS: 1.0000 | ORAL_TABLET | Freq: Four times a day (QID) | ORAL | Status: DC | PRN

## 2015-03-28 MED ORDER — SODIUM CHLORIDE 0.9 % IR SOLN
Status: DC | PRN
  Administered 2015-03-28: 1000 mL

## 2015-03-28 MED ORDER — LACTATED RINGERS IV SOLN
INTRAVENOUS | Status: DC
  Administered 2015-03-28: 1000 mL via INTRAVENOUS

## 2015-03-28 MED ORDER — MIDAZOLAM HCL 2 MG/2ML IJ SOLN
INTRAMUSCULAR | Status: AC
  Filled 2015-03-28: qty 2

## 2015-03-28 MED ORDER — FENTANYL CITRATE (PF) 100 MCG/2ML IJ SOLN
25.0000 ug | INTRAMUSCULAR | Status: AC
  Administered 2015-03-28 (×2): 25 ug via INTRAVENOUS

## 2015-03-28 MED ORDER — LIDOCAINE HCL (CARDIAC) 20 MG/ML IV SOLN
INTRAVENOUS | Status: DC | PRN
  Administered 2015-03-28: 10 mg via INTRATRACHEAL

## 2015-03-28 MED ORDER — SODIUM CHLORIDE 0.9 % IJ SOLN
INTRAMUSCULAR | Status: AC
  Filled 2015-03-28: qty 10

## 2015-03-28 MED ORDER — PROPOFOL 10 MG/ML IV BOLUS
INTRAVENOUS | Status: AC
  Filled 2015-03-28: qty 20

## 2015-03-28 MED ORDER — LIDOCAINE HCL (PF) 0.5 % IJ SOLN
INTRAMUSCULAR | Status: DC | PRN
  Administered 2015-03-28: 50 mL via INTRAVENOUS

## 2015-03-28 MED ORDER — FENTANYL CITRATE (PF) 100 MCG/2ML IJ SOLN
INTRAMUSCULAR | Status: DC | PRN
  Administered 2015-03-28: 25 ug via INTRAVENOUS
  Administered 2015-03-28: 50 ug via INTRAVENOUS

## 2015-03-28 MED ORDER — CHLORHEXIDINE GLUCONATE 4 % EX LIQD: 60.0000 mL | Freq: Once | CUTANEOUS | Status: DC

## 2015-03-28 MED ORDER — MIDAZOLAM HCL 2 MG/2ML IJ SOLN
1.0000 mg | INTRAMUSCULAR | Status: DC | PRN
  Administered 2015-03-28: 2 mg via INTRAVENOUS

## 2015-03-28 MED ORDER — SODIUM CHLORIDE 0.9 % IJ SOLN
INTRAMUSCULAR | Status: DC | PRN
  Administered 2015-03-28: 1 mL

## 2015-03-28 SURGICAL SUPPLY — 37 items
BAG HAMPER (MISCELLANEOUS) ×3 IMPLANT
BANDAGE ELASTIC 3 VELCRO NS (GAUZE/BANDAGES/DRESSINGS) ×3 IMPLANT
BANDAGE ESMARK 4X12 BL STRL LF (DISPOSABLE) ×1 IMPLANT
BNDG ESMARK 4X12 BLUE STRL LF (DISPOSABLE) ×3
CLOTH BEACON ORANGE TIMEOUT ST (SAFETY) ×3 IMPLANT
COVER LIGHT HANDLE STERIS (MISCELLANEOUS) ×6 IMPLANT
CUFF TOURNIQUET SINGLE 18IN (TOURNIQUET CUFF) ×3 IMPLANT
DRSG XEROFORM 1X8 (GAUZE/BANDAGES/DRESSINGS) ×2 IMPLANT
DURAPREP 26ML APPLICATOR (WOUND CARE) ×3 IMPLANT
ELECT NDL TIP 2.8 STRL (NEEDLE) IMPLANT
ELECT NEEDLE TIP 2.8 STRL (NEEDLE) ×3 IMPLANT
ELECT REM PT RETURN 9FT ADLT (ELECTROSURGICAL) ×3
ELECTRODE REM PT RTRN 9FT ADLT (ELECTROSURGICAL) ×1 IMPLANT
FORMALIN 10 PREFIL 120ML (MISCELLANEOUS) ×3 IMPLANT
GAUZE SPONGE 4X4 12PLY STRL (GAUZE/BANDAGES/DRESSINGS) ×3 IMPLANT
GLOVE BIO SURGEON STRL SZ8 (GLOVE) ×3 IMPLANT
GLOVE BIO SURGEON STRL SZ8.5 (GLOVE) ×3 IMPLANT
GLOVE BIOGEL PI IND STRL 7.0 (GLOVE) ×1 IMPLANT
GLOVE BIOGEL PI INDICATOR 7.0 (GLOVE) ×6
GLOVE ECLIPSE 6.5 STRL STRAW (GLOVE) ×2 IMPLANT
GOWN STRL REUS W/TWL LRG LVL3 (GOWN DISPOSABLE) ×4 IMPLANT
GOWN STRL REUS W/TWL XL LVL3 (GOWN DISPOSABLE) ×3 IMPLANT
KIT ROOM TURNOVER APOR (KITS) ×3 IMPLANT
NDL HYPO 18GX1.5 BLUNT FILL (NEEDLE) ×1 IMPLANT
NDL HYPO 27GX1-1/4 (NEEDLE) ×1 IMPLANT
NEEDLE HYPO 18GX1.5 BLUNT FILL (NEEDLE) ×3 IMPLANT
NEEDLE HYPO 27GX1-1/4 (NEEDLE) ×3 IMPLANT
NS IRRIG 1000ML POUR BTL (IV SOLUTION) ×3 IMPLANT
PACK BASIC LIMB (CUSTOM PROCEDURE TRAY) ×3 IMPLANT
PAD ARMBOARD 7.5X6 YLW CONV (MISCELLANEOUS) ×3 IMPLANT
PAD CAST 3X4 CTTN HI CHSV (CAST SUPPLIES) ×1 IMPLANT
PADDING CAST COTTON 3X4 STRL (CAST SUPPLIES) ×2
SET BASIN LINEN APH (SET/KITS/TRAYS/PACK) ×3 IMPLANT
SPONGE GAUZE 4X4 12PLY (GAUZE/BANDAGES/DRESSINGS) ×1 IMPLANT
SUT ETHILON 3 0 FSL (SUTURE) ×3 IMPLANT
SYR 3ML LL SCALE MARK (SYRINGE) ×3 IMPLANT
VESSEL LOOPS MAXI RED (MISCELLANEOUS) ×3 IMPLANT

## 2015-03-28 NOTE — Op Note (Signed)
NAMRainey Pines:  Nicotra, Sehar                ACCOUNT NO.:  [id number]  MEDICAL RECORD NO.:  [id number]  LOCATION:  [facility]                          FACILITY:  [facility]  PHYSICIAN:  J. Darreld McleanWayne Kimila Papaleo, M.D. DATE OF BIRTH:  27-Jun-1970  DATE OF PROCEDURE: DATE OF DISCHARGE:                              OPERATIVE REPORT   PREOPERATIVE DIAGNOSIS:  Carpal tunnel syndrome, left.  POSTOPERATIVE DIAGNOSIS:  Carpal tunnel syndrome, left.  PROCEDURE:  Release of volar carpal ligament, saline neurolysis, epineurotomy of the left median nerve.  ANESTHESIA:  Bier block.  TOURNIQUET:  28 minute tourniquet.  SURGEON:  J. Darreld McleanWayne Krysti Hickling, MD  INDICATIONS:  Patient has had pain and tenderness, nocturnal pain, decreased sensation in the median nerve distribution for several months. EMG showed left moderate carpal tunnel syndrome with no polyneuropathy. I have recommended surgery of the wrist and release of the volar carpal ligament.  I have gone over the risks and imponderables with her.  She appears to understand and agreed to procedure as outlined.  The patient does have a history of substance abuse and is on methadone, gave her some pain medicine just for a few days.  She is to see her pain management doctor later today if she can or tomorrow for sure and I can help her regulate the pain medicine.  DESCRIPTION OF PROCEDURE:  The patient was seen in the holding area. Left hand was identified as correct surgical site.  A mark was placed in a left volar wrist.  The patient was brought to the operating room, placed supine on the operating room table.  Hand table attached. Tourniquet applied loosely, then a Bier block anesthesia given by Anesthesia.  She was prepped and draped in the usual manner.  I had a time-out identifying the patient as Ms. Christell ConstantMoore doing her left hand for carpal tunnel release.  All instrumentation was properly positioned and working.  The OR team knew each other.  Outline for incision was  made.  On  careful dissection, the median nerve was identified proximally and vessel loop placed around the median nerve.  A groove director was then applied within the carpal tunnel space and the volar carpal ligament was incised.  Specimen of the volar carpal ligament sent to Pathology.  Saline neurolysis epineurotomy carried out.  It should be noticed that the nerve was markedly compressed before this was done.  Retinaculum cut proximally.  There was suspected no apparent injury, vessel loop removed and the wound was then reapproximated using 3-0 nylon interrupted vertical mattress manner. Sterile dressing applied.  Bulky dressing applied.  Hand dressing applied with sheet cotton cut dorsally.  ACE bandage applied loosely. The patient tolerated procedure well, will go to recovery in good condition.          ______________________________ Shela CommonsJ. Darreld McleanWayne Ciaira Natividad, M.D.     JWK/MEDQ  D:  03/28/2015  T:  03/28/2015  Job:  [id number]

## 2015-03-28 NOTE — Anesthesia Preprocedure Evaluation (Signed)
Anesthesia Evaluation  Patient identified by MRN, date of birth, ID band Patient awake    Reviewed: Allergy & Precautions, NPO status , Patient's Chart, lab work & pertinent test results  Airway Mallampati: II  TM Distance: >3 FB     Dental  (+) Teeth Intact, Implants, Dental Advisory Given,    Pulmonary Current Smoker,    breath sounds clear to auscultation       Cardiovascular hypertension, Pt. on medications  Rhythm:Regular Rate:Normal     Neuro/Psych    GI/Hepatic negative GI ROS,   Endo/Other    Renal/GU      Musculoskeletal  (+) Fibromyalgia -  Abdominal   Peds  Hematology   Anesthesia Other Findings   Reproductive/Obstetrics                             Anesthesia Physical Anesthesia Plan  ASA: II  Anesthesia Plan: Bier Block   Post-op Pain Management:    Induction: Intravenous  Airway Management Planned: Simple Face Mask  Additional Equipment:   Intra-op Plan:   Post-operative Plan:   Informed Consent: I have reviewed the patients History and Physical, chart, labs and discussed the procedure including the risks, benefits and alternatives for the proposed anesthesia with the patient or authorized representative who has indicated his/her understanding and acceptance.     Plan Discussed with:   Anesthesia Plan Comments:         Anesthesia Quick Evaluation

## 2015-03-28 NOTE — Discharge Instructions (Signed)
Elevate hand as needed.  Move fingers often.  Use ice as needed.  Keep wound dressing dry and intact.  Do not remove.  See Dr. Hilda Lias in one week in his office.  If any problem, call his office at [phone] or if after hours, the hospital at [phone].  PATIENT INSTRUCTIONS POST-ANESTHESIA  IMMEDIATELY FOLLOWING SURGERY:  Do not drive or operate machinery for the first twenty four hours after surgery.  Do not make any important decisions for twenty four hours after surgery or while taking narcotic pain medications or sedatives.  If you develop intractable nausea and vomiting or a severe headache please notify your doctor immediately.  FOLLOW-UP:  Please make an appointment with your surgeon as instructed. You do not need to follow up with anesthesia unless specifically instructed to do so.  WOUND CARE INSTRUCTIONS (if applicable):  Keep a dry clean dressing on the anesthesia/puncture wound site if there is drainage.  Once the wound has quit draining you may leave it open to air.  Generally you should leave the bandage intact for twenty four hours unless there is drainage.  If the epidural site drains for more than 36-48 hours please call the anesthesia department.  QUESTIONS?:  Please feel free to call your physician or the hospital operator if you have any questions, and they will be happy to assist you.      Carpal Tunnel Release, Care After Refer to this sheet in the next few weeks. These instructions provide you with information about caring for yourself after your procedure. Your health care provider may also give you more specific instructions. Your treatment has been planned according to current medical practices, but problems sometimes occur. Call your health care provider if you have any problems or questions after your procedure. WHAT TO EXPECT AFTER THE PROCEDURE After your procedure, it is typical to have the  following:  Pain.  Numbness.  Tingling.  Swelling.  Stiffness.  Bruising. HOME CARE INSTRUCTIONS  Take medicines only as directed by your health care provider.  There are many different ways to close and cover an incision, including stitches (sutures), skin glue, and adhesive strips. Follow your health care provider's instructions about:  Incision care.  Bandage (dressing) changes and removal.  Incision closure removal.  Wear a splint or a brace as directed by your surgeon. You may need to do this for 2-3 weeks.  Keep your hand raised (elevated) above the level of your heart while you are resting. Move your fingers often.  Avoid activities that cause hand pain.  Ask your surgeon when you can start to do all of your usual activities again, such as:  Driving.  Returning to work.  Bathing and swimming.  Keep all follow-up visits as directed by your health care provider. This is important. You may need physical therapy for several months to speed healing and regain movement. SEEK MEDICAL CARE IF:  You have drainage, redness, swelling, or pain at your incision site.  You have a fever.  You have chills.  Your pain medicine is not working.  Your symptoms do not go away after 2 months.  Your symptoms go away and then return. SEEK IMMEDIATE MEDICAL CARE IF:  You have pain or numbness that is getting worse.  Your fingers change color.  You are not able to move your fingers.   This information is not intended to replace advice given to you by your health care provider. Make sure you discuss any questions you have with your  health care provider.   Document Released: 10/03/2004 Document Revised: 04/06/2014 Document Reviewed: 11/01/2013 Elsevier Interactive Patient Education [employer]2016 Elsevier Inc.

## 2015-03-28 NOTE — Transfer of Care (Signed)
Immediate Anesthesia Transfer of Care Note  Patient: Daisy ReichertGladys D Bonet  Procedure(s) Performed: Procedure(s): LEFT CARPAL TUNNEL RELEASE (Left)  Patient Location: PACU  Anesthesia Type:Regional  Level of Consciousness: awake, alert  and oriented  Airway & Oxygen Therapy: Patient Spontanous Breathing  Post-op Assessment: Report given to RN and Post -op Vital signs reviewed and stable  Post vital signs: Reviewed and stable  Last Vitals:  Filed Vitals:   03/28/15 0720 03/28/15 0725  BP: 136/85 132/87  Temp:    Resp: 68 27    Complications: No apparent anesthesia complications

## 2015-03-28 NOTE — Brief Op Note (Signed)
03/28/2015  8:09 AM  PATIENT:  [name]  44 y.o. female  PRE-OPERATIVE DIAGNOSIS:  left carpal tunnel syndrome  POST-OPERATIVE DIAGNOSIS:  left carpal tunnel syndrome  PROCEDURE:  Procedure(s): LEFT CARPAL TUNNEL RELEASE (Left)  SURGEON:  Surgeon(s) and Role:    * Darreld McleanWayne Briggitte Boline, MD - Primary  PHYSICIAN ASSISTANT:   ASSISTANTS: none   ANESTHESIA:   regional  EBL:     BLOOD ADMINISTERED:none  DRAINS: none   LOCAL MEDICATIONS USED:  NONE  SPECIMEN:  Source of Specimen:  left volar carpal ligament  DISPOSITION OF SPECIMEN:  PATHOLOGY  COUNTS:  YES  TOURNIQUET:   Total Tourniquet Time Documented: Upper Arm (Left) - 28 minutes Total: Upper Arm (Left) - 28 minutes   DICTATION: .Other Dictation: Dictation Number [id number]  PLAN OF CARE: Discharge to home after PACU  PATIENT DISPOSITION:  PACU - hemodynamically stable.   Delay start of Pharmacological VTE agent (>24hrs) due to surgical blood loss or risk of bleeding: not applicable

## 2015-03-28 NOTE — Anesthesia Procedure Notes (Signed)
Anesthesia Regional Block:  Bier block (IV Regional)  Pre-Anesthetic Checklist: ,, timeout performed, Correct Patient, Correct Site, Correct Laterality, Correct Procedure,, site marked, surgical consent,, at surgeon's request Needles:  Injection technique: Single-shot  Needle Type: Other      Needle Gauge: 22 and 22 G    Additional Needles: Bier block (IV Regional)  Nerve Stimulator or Paresthesia:   Additional Responses:  Pulse checked post tourniquet inflation. IV NSL discontinued post injection. Bier block, Rt hand, mark visible Narrative:   Performed by: Personally

## 2015-03-28 NOTE — Progress Notes (Signed)
The History and Physical is unchanged. I have examined the patient. The patient is medically able to have surgery on the left hand/wrist . Daisy McleanKEELING,[name]

## 2015-03-29 ENCOUNTER — Encounter (HOSPITAL_COMMUNITY): Payer: Self-pay | Admitting: Orthopaedic Surgery

## 2015-03-29 NOTE — Anesthesia Postprocedure Evaluation (Signed)
Anesthesia Post Note Late entry Patient: Daisy ReichertGladys D Freeman  Procedure(s) Performed: Procedure(s) (LRB): LEFT CARPAL TUNNEL RELEASE (Left)  Patient location during evaluation: Short Stay Anesthesia Type: Bier Block Level of consciousness: awake and alert and oriented Pain management: pain level controlled Vital Signs Assessment: post-procedure vital signs reviewed and stable Respiratory status: spontaneous breathing Cardiovascular status: blood pressure returned to baseline Postop Assessment: adequate PO intake Anesthetic complications: no    Last Vitals:  Filed Vitals:   03/28/15 0834 03/28/15 0844  BP: 133/86 140/77  Pulse:  71  Temp:  36.4 C  Resp:  14    Last Pain:  Filed Vitals:   03/29/15 0949  PainSc: 3                  Daelan Gatt

## 2015-05-08 ENCOUNTER — Encounter: Payer: Self-pay | Admitting: Orthopaedic Surgery

## 2015-05-08 ENCOUNTER — Ambulatory Visit (INDEPENDENT_AMBULATORY_CARE_PROVIDER_SITE_OTHER): Payer: 59 | Admitting: Orthopaedic Surgery

## 2015-05-08 VITALS — BP 110/76 | HR 80 | Ht 60.0 in | Wt 133.0 lb

## 2015-05-08 DIAGNOSIS — G5602 Carpal tunnel syndrome, left upper limb: Secondary | ICD-10-CM | POA: Insufficient documentation

## 2015-05-08 NOTE — Patient Instructions (Addendum)
Avoid things that constantly vibrate RTW note    You Can Quit Smoking If you are ready to quit smoking or are thinking about it, congratulations! You have chosen to help yourself be healthier and live longer! There are lots of different ways to quit smoking. Nicotine gum, nicotine patches, a nicotine inhaler, or nicotine nasal spray can help with physical craving. Hypnosis, support groups, and medicines help break the habit of smoking. TIPS TO GET OFF AND STAY OFF CIGARETTES  Learn to predict your moods. Do not let a bad situation be your excuse to have a cigarette. Some situations in your life might tempt you to have a cigarette.  Ask friends and co-workers not to smoke around you.  Make your home smoke-free.  Never have "just one" cigarette. It leads to wanting another and another. Remind yourself of your decision to quit.  On a card, make a list of your reasons for not smoking. Read it at least the same number of times a day as you have a cigarette. Tell yourself everyday, "I do not want to smoke. I choose not to smoke."  Ask someone at home or work to help you with your plan to quit smoking.  Have something planned after you eat or have a cup of coffee. Take a walk or get other exercise to perk you up. This will help to keep you from overeating.  Try a relaxation exercise to calm you down and decrease your stress. Remember, you may be tense and nervous the first two weeks after you quit. This will pass.  Find new activities to keep your hands busy. Play with a pen, coin, or rubber band. Doodle or draw things on paper.  Brush your teeth right after eating. This will help cut down the craving for the taste of tobacco after meals. You can try mouthwash too.  Try gum, breath mints, or diet candy to keep something in your mouth. IF YOU SMOKE AND WANT TO QUIT:  Do not stock up on cigarettes. Never buy a carton. Wait until one pack is finished before you buy another.  Never carry  cigarettes with you at work or at home.  Keep cigarettes as far away from you as possible. Leave them with someone else.  Never carry matches or a lighter with you.  Ask yourself, "Do I need this cigarette or is this just a reflex?"  Bet with someone that you can quit. Put cigarette money in a piggy bank every morning. If you smoke, you give up the money. If you do not smoke, by the end of the week, you keep the money.  Keep trying. It takes 21 days to change a habit!  Talk to your doctor about using medicines to help you quit. These include nicotine replacement gum, lozenges, or skin patches.   This information is not intended to replace advice given to you by your health care provider. Make sure you discuss any questions you have with your health care provider.   Document Released: 01/10/2009 Document Revised: 06/08/2011 Document Reviewed: 01/10/2009 Elsevier Interactive Patient Education [employer]

## 2015-05-08 NOTE — Progress Notes (Signed)
She is seen in follow up for carpal tunnel surgery on the left.  She is doing very well.  She has no problem.  She is using her left hand well.  She has full sensation.  Wound is healed. ROM is full.  I will see her as needed.

## 2015-07-18 ENCOUNTER — Other Ambulatory Visit: Payer: Self-pay | Admitting: Family Medicine

## 2015-08-13 ENCOUNTER — Ambulatory Visit: Payer: 59 | Admitting: Family Medicine

## 2015-09-26 ENCOUNTER — Other Ambulatory Visit: Payer: Self-pay | Admitting: Family Medicine

## 2015-11-22 ENCOUNTER — Encounter: Payer: Self-pay | Admitting: Nurse Practitioner

## 2015-11-22 ENCOUNTER — Ambulatory Visit (INDEPENDENT_AMBULATORY_CARE_PROVIDER_SITE_OTHER): Payer: 59 | Admitting: Nurse Practitioner

## 2015-11-22 VITALS — BP 128/86 | Ht 60.0 in | Wt 138.0 lb

## 2015-11-22 DIAGNOSIS — K5903 Drug induced constipation: Secondary | ICD-10-CM

## 2015-11-22 DIAGNOSIS — M797 Fibromyalgia: Secondary | ICD-10-CM | POA: Diagnosis not present

## 2015-11-22 DIAGNOSIS — T402X5A Adverse effect of other opioids, initial encounter: Secondary | ICD-10-CM | POA: Diagnosis not present

## 2015-11-22 DIAGNOSIS — I10 Essential (primary) hypertension: Secondary | ICD-10-CM | POA: Diagnosis not present

## 2015-11-22 MED ORDER — LUBIPROSTONE 24 MCG PO CAPS: 24.0000 ug | ORAL_CAPSULE | Freq: Two times a day (BID) | ORAL | 2 refills | Status: DC

## 2015-11-22 MED ORDER — LISINOPRIL-HYDROCHLOROTHIAZIDE 10-12.5 MG PO TABS: ORAL_TABLET | ORAL | 1 refills | Status: DC

## 2015-11-22 NOTE — Patient Instructions (Signed)
Amitriptyline Cymbalta

## 2015-11-23 ENCOUNTER — Encounter: Payer: Self-pay | Admitting: Nurse Practitioner

## 2015-11-23 NOTE — Progress Notes (Signed)
Subjective:  Presents for routine follow up on HTN. No CP/ischemic type pain or SOB. 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 Unrelieved with Miralax. Sometimes will go up to a week without BM. No blood in stools.   Objective:   BP 128/86   Ht 5' (1.524 m)   Wt 138 lb (62.6 kg)   BMI 26.95 kg/m  NAD. Alert, oriented. Lungs clear. Heart RRR. Abdomen soft, non tender. No obvious masses.   Assessment:  Problem List Items Addressed This Visit      Cardiovascular and Mediastinum   Hypertension - Primary   Relevant Medications   lisinopril-hydrochlorothiazide (PRINZIDE,ZESTORETIC) 10-12.5 MG tablet     Digestive   Constipation due to opioid therapy     Musculoskeletal and Integument   Fibromyalgia    Other Visit Diagnoses   None.    Plan:  Meds ordered this encounter  Medications  . lubiprostone ([name]) 24 MCG capsule    Sig: Take 1 capsule (24 mcg total) by mouth 2 (two) times daily with a meal.    Dispense:  60 capsule    Refill:  2    Order Specific Question:   Supervising Provider    Answer:   Merlyn AlbertLUKING, WILLIAM S [[id number]]  . lisinopril-hydrochlorothiazide (PRINZIDE,ZESTORETIC) 10-12.5 MG tablet    Sig: TAKE ONE TABLET BY MOUTH ONCE DAILY    Dispense:  90 tablet    Refill:  1    Order Specific Question:   Supervising Provider    Answer:   Merlyn AlbertLUKING, WILLIAM S [[id number]]   Call back if no improvement in constipation.  Return in about 6 months (around 05/24/2016) for recheck. Reminded patient about preventive health physical.

## 2015-11-28 ENCOUNTER — Telehealth: Payer: Self-pay | Admitting: Nurse Practitioner

## 2015-11-28 NOTE — Telephone Encounter (Signed)
Prior Authorization for [name] was denied by patient insurance. Please advise?

## 2015-11-28 NOTE — Telephone Encounter (Signed)
Would they approve amitiza or linzess? Would they approve name brand or generic amitiza?

## 2015-11-28 NOTE — Telephone Encounter (Signed)
[name] is rejected for both generic and brand name. Would you like to proceed with Linzess?

## 2015-12-09 ENCOUNTER — Other Ambulatory Visit: Payer: Self-pay | Admitting: Nurse Practitioner

## 2015-12-09 NOTE — Telephone Encounter (Signed)
Tried to call- not accepting calls at this time.

## 2015-12-09 NOTE — Telephone Encounter (Signed)
Please let patient know and find out if she wants to try Linzess. Thanks.

## 2015-12-13 NOTE — Telephone Encounter (Signed)
[facility]-Not accepting calls

## 2015-12-18 NOTE — Telephone Encounter (Signed)
The number is patient's chart is no longer a working number.

## 2015-12-20 NOTE — Telephone Encounter (Signed)
Can you please send a note asking her to call us? Thanks.

## 2015-12-23 NOTE — Telephone Encounter (Signed)
Card sent to return call 

## 2016-05-22 ENCOUNTER — Ambulatory Visit: Payer: Self-pay | Admitting: Nurse Practitioner

## 2016-05-22 DIAGNOSIS — Z0289 Encounter for other administrative examinations: Secondary | ICD-10-CM

## 2016-05-25 ENCOUNTER — Other Ambulatory Visit: Payer: Self-pay | Admitting: Nurse Practitioner

## 2016-05-26 ENCOUNTER — Encounter: Payer: Self-pay | Admitting: Family Medicine

## 2016-06-05 ENCOUNTER — Encounter: Payer: Self-pay | Admitting: Nurse Practitioner

## 2016-06-05 ENCOUNTER — Ambulatory Visit (INDEPENDENT_AMBULATORY_CARE_PROVIDER_SITE_OTHER): Payer: Managed Care, Other (non HMO) | Admitting: Nurse Practitioner

## 2016-06-05 VITALS — BP 122/86 | Temp 98.2°F | Ht 60.0 in | Wt 145.0 lb

## 2016-06-05 DIAGNOSIS — R5383 Other fatigue: Secondary | ICD-10-CM | POA: Diagnosis not present

## 2016-06-05 DIAGNOSIS — J300 Vasomotor rhinitis: Secondary | ICD-10-CM

## 2016-06-05 DIAGNOSIS — Z13220 Encounter for screening for lipoid disorders: Secondary | ICD-10-CM

## 2016-06-05 DIAGNOSIS — I10 Essential (primary) hypertension: Secondary | ICD-10-CM

## 2016-06-05 MED ORDER — LISINOPRIL-HYDROCHLOROTHIAZIDE 10-12.5 MG PO TABS: 1.0000 | ORAL_TABLET | Freq: Every day | ORAL | 1 refills | Status: DC

## 2016-06-05 NOTE — Patient Instructions (Addendum)
OTC steroid nasal spray such as flonase

## 2016-06-05 NOTE — Progress Notes (Signed)
Subjective:  Presents today for routine follow up for HTN and sinus pressure.  HTN well controlled with lisinopril/HCTZ 10/12.5mg  daily, good compliance and tolerance. Denies SOB, wheezing, or cough. Denies any Cp, palpitations, LE swelling.    Has had sinus pressure and fluid/crackles in ear for over one month.  Denies fever, chills, sore throat, ear pain, nausea, or vomiting.  Has been taking Mucinex daily for past month with minimal relief.    Objective:   BP 122/86   Temp 98.2 F (36.8 C) (Oral)   Ht 5' (1.524 m)   Wt 145 lb (65.8 kg)   BMI 28.32 kg/m  Alert and oriented. NAD.  Tm's: mild clear effusion bilat., Pharynx: mildly injected, +PND, cloudy  Neck: mild ant.cervical adenopathy, neck supple Chest; Lungs CTA Cardiac: RRR, no murmurs. Lower extremities no edema.   Assessment:  Essential hypertension - Plan: Lipid panel, Basic metabolic panel  Acute vasomotor rhinitis  Fatigue, unspecified type - Plan: VITAMIN D 25 Hydroxy (Vit-D Deficiency, Fractures), Hepatic function panel, TSH, Basic metabolic panel  Screening for lipid disorders - Plan: Lipid panel   Plan:   Meds ordered this encounter  Medications  . lisinopril-hydrochlorothiazide (PRINZIDE,ZESTORETIC) 10-12.5 MG tablet    Sig: Take 1 tablet by mouth daily.    Dispense:  90 tablet    Refill:  1    Order Specific Question:   Supervising Provider    Answer:   Merlyn AlbertLUKING, WILLIAM S [[id number]]   Refill Lisinopril-HCTZ, continue current regimen  Labs pending  Recommend appointment for routine physical  Discussed importance of continually improving diet and increasing activity level, decreasing sweets intake, and smoking cessation.    Start OTC steroid nasal spray for relief of sinus pressure.  Notify office if symptoms do not improve or worsen.    Return in about 6 months (around 12/06/2016) for recheck.

## 2016-12-04 ENCOUNTER — Other Ambulatory Visit: Payer: Self-pay | Admitting: Nurse Practitioner

## 2016-12-23 ENCOUNTER — Ambulatory Visit (INDEPENDENT_AMBULATORY_CARE_PROVIDER_SITE_OTHER): Payer: 59 | Admitting: Nurse Practitioner

## 2016-12-23 ENCOUNTER — Encounter: Payer: Self-pay | Admitting: Nurse Practitioner

## 2016-12-23 VITALS — BP 120/76 | Ht 60.0 in | Wt 141.4 lb

## 2016-12-23 DIAGNOSIS — I10 Essential (primary) hypertension: Secondary | ICD-10-CM

## 2016-12-23 DIAGNOSIS — Z23 Encounter for immunization: Secondary | ICD-10-CM | POA: Diagnosis not present

## 2016-12-23 MED ORDER — LISINOPRIL-HYDROCHLOROTHIAZIDE 10-12.5 MG PO TABS: 1.0000 | ORAL_TABLET | Freq: Every day | ORAL | 0 refills | Status: DC

## 2016-12-23 MED ORDER — LISINOPRIL-HYDROCHLOROTHIAZIDE 10-12.5 MG PO TABS: 1.0000 | ORAL_TABLET | Freq: Every day | ORAL | 1 refills | Status: DC

## 2016-12-25 ENCOUNTER — Encounter: Payer: Self-pay | Admitting: Nurse Practitioner

## 2016-12-25 NOTE — Progress Notes (Signed)
Subjective:  Presents for recheck on her hypertension. No chest pain/ischemic type pain or shortness of breath. Had labs done at the clinic where she gets her methadone, will have a copy sent to our office for review. States she was told her labs look good. Thinks a lipid profile was also included. Staying active. Keeping her weight stable.  Objective:   BP 120/76   Ht 5' (1.524 m)   Wt 141 lb 6 oz (64.1 kg)   BMI 27.61 kg/m  NAD. Alert, oriented. Cheerful affect. Lungs clear. Heart regular rate rhythm. Lower extremities no edema.  Assessment:   Problem List Items Addressed This Visit      Cardiovascular and Mediastinum   Hypertension - Primary   Relevant Medications   lisinopril-hydrochlorothiazide (PRINZIDE,ZESTORETIC) 10-12.5 MG tablet    Other Visit Diagnoses    Need for influenza vaccination       Relevant Orders   Flu Vaccine QUAD 6+ mos PF IM (Fluarix Quad PF) (Completed)       Plan:   Meds ordered this encounter  Medications  . DISCONTD: lisinopril-hydrochlorothiazide (PRINZIDE,ZESTORETIC) 10-12.5 MG tablet    Sig: Take 1 tablet by mouth daily.    Dispense:  30 tablet    Refill:  0    Order Specific Question:   Supervising Provider    Answer:   Merlyn Albert [[id number]]  . lisinopril-hydrochlorothiazide (PRINZIDE,ZESTORETIC) 10-12.5 MG tablet    Sig: Take 1 tablet by mouth daily.    Dispense:  90 tablet    Refill:  1    Order Specific Question:   Supervising Provider    Answer:   Merlyn Albert [[id number]]   Continue current blood pressure medication. Send copy of labs to our office. Discussed lifestyle factors affecting blood pressure including stress and tobacco use. Reminded patient about preventive health physical. Return in about 6 months (around 06/22/2017) for recheck HTN.

## 2017-09-06 ENCOUNTER — Telehealth: Payer: Self-pay | Admitting: Family Medicine

## 2017-09-06 ENCOUNTER — Other Ambulatory Visit: Payer: Self-pay | Admitting: Nurse Practitioner

## 2017-09-06 MED ORDER — LISINOPRIL-HYDROCHLOROTHIAZIDE 10-12.5 MG PO TABS: 1.0000 | ORAL_TABLET | Freq: Every day | ORAL | 0 refills | Status: DC

## 2017-09-06 NOTE — Telephone Encounter (Signed)
Pt requesting refill on lisinopril-hydrochlorothiazide (PRINZIDE,ZESTORETIC) 10-12.5 MG tablet [facility] [place], [place] - [address]. Pt has appt on 09/13/17 for med refill.

## 2017-09-06 NOTE — Telephone Encounter (Signed)
Done

## 2017-09-13 ENCOUNTER — Encounter: Payer: Self-pay | Admitting: Nurse Practitioner

## 2017-09-13 ENCOUNTER — Ambulatory Visit (INDEPENDENT_AMBULATORY_CARE_PROVIDER_SITE_OTHER): Payer: Self-pay | Admitting: Nurse Practitioner

## 2017-09-13 VITALS — BP 132/94 | Ht 60.0 in | Wt 138.4 lb

## 2017-09-13 DIAGNOSIS — I10 Essential (primary) hypertension: Secondary | ICD-10-CM

## 2017-09-14 ENCOUNTER — Encounter: Payer: Self-pay | Admitting: Nurse Practitioner

## 2017-09-14 NOTE — Progress Notes (Addendum)
Subjective: Presents for recheck on her hypertension.  Patient is no longer insured.  Has decreased sugar and regular sodas.  Smokes less than 1 pack/day.  Has started using a flavored vape which has helped her cut back on nicotine.  No chest pain/ischemic type pain or shortness of breath.  No edema.  Limited activity.  Has done better with her diet.  Objective:   BP (!) 132/94   Ht 5' (1.524 m)   Wt 138 lb 6.4 oz (62.8 kg)   BMI 27.03 kg/m  NAD.  Alert, oriented.  Cheerful affect.  Lungs clear.  Heart regular rate and rhythm.  Carotids no bruits or thrills.  Lower extremities no edema.  BP on recheck right arm sitting 156/98.  Assessment:   Problem List Items Addressed This Visit      Cardiovascular and Mediastinum   Hypertension - Primary       Plan: Increase Zestoretic 10/12.5 to 2 pills/day.  Discussed lifestyle factors affecting her blood pressure.  Call back if any adverse effects with increased dosage. Return in about 6 months (around 03/15/2018) for BP recheck.  Requests to extend time for next visit due to lack of insurance at this time.  Call back sooner if needed.

## 2017-10-18 ENCOUNTER — Other Ambulatory Visit: Payer: Self-pay | Admitting: *Deleted

## 2017-10-18 ENCOUNTER — Telehealth: Payer: Self-pay | Admitting: Family Medicine

## 2017-10-18 MED ORDER — LISINOPRIL-HYDROCHLOROTHIAZIDE 10-12.5 MG PO TABS: 2.0000 | ORAL_TABLET | Freq: Every day | ORAL | 1 refills | Status: DC

## 2017-10-18 NOTE — Telephone Encounter (Signed)
Pt calling in to report she would like a refill on her lisinopril. Eber JonesCarolyn had uped it to 2 a day and she would like to continue with that. [facility] [place], [place] - [address].

## 2017-10-18 NOTE — Telephone Encounter (Signed)
Seen 09/13/17 for hypertension.

## 2017-10-18 NOTE — Telephone Encounter (Signed)
Ok 6 months per dr Brett Canalessteve. Refills sent to pharm. Pt notified.

## 2018-04-26 ENCOUNTER — Other Ambulatory Visit: Payer: Self-pay | Admitting: Family Medicine

## 2018-04-26 NOTE — Telephone Encounter (Signed)
Call pt, sched six mo f u, when she does that , may ref times one

## 2018-04-27 ENCOUNTER — Telehealth: Payer: Self-pay | Admitting: Family Medicine

## 2018-04-27 MED ORDER — LISINOPRIL-HYDROCHLOROTHIAZIDE 10-12.5 MG PO TABS: 2.0000 | ORAL_TABLET | Freq: Every day | ORAL | 0 refills | Status: DC

## 2018-04-27 NOTE — Telephone Encounter (Signed)
Requesting lisinopril-hydrochlorothiazide (PRINZIDE,ZESTORETIC) 10-12.5 MG tablet   [facility]acy:  [facility] [address] - [place], [place] - [phone] [place] [address]  Med check 05/23/18 with Dr.Steve

## 2018-04-27 NOTE — Telephone Encounter (Signed)
Patient is sched for 05/23/2017 and a refill has been sent.

## 2018-04-27 NOTE — Telephone Encounter (Signed)
Patient is aware 

## 2018-05-23 ENCOUNTER — Ambulatory Visit: Payer: Self-pay | Admitting: Family Medicine

## 2018-05-26 ENCOUNTER — Telehealth: Payer: Self-pay | Admitting: Family Medicine

## 2018-05-26 ENCOUNTER — Other Ambulatory Visit: Payer: Self-pay | Admitting: *Deleted

## 2018-05-26 MED ORDER — LISINOPRIL-HYDROCHLOROTHIAZIDE 10-12.5 MG PO TABS: 2.0000 | ORAL_TABLET | Freq: Every day | ORAL | 0 refills | Status: DC

## 2018-05-26 NOTE — Telephone Encounter (Signed)
Refill sent to pharm per dr Brett Canales one month supply since pt has upcoming appt. Pt notified.

## 2018-05-26 NOTE — Telephone Encounter (Signed)
Requesting refill for lisinopril-hydrochlorothiazide (PRINZIDE,ZESTORETIC) 10-12.5 MG tablet   Pharmacy:  [facility] [address], [place] - [phone] [place] [address]  Patient has med check appt for 06/01/18 with Dr.Steve

## 2018-06-01 ENCOUNTER — Encounter: Payer: Self-pay | Admitting: Family Medicine

## 2018-06-01 ENCOUNTER — Ambulatory Visit: Payer: BC Managed Care – PPO | Admitting: Family Medicine

## 2018-06-01 VITALS — BP 132/78 | Wt 145.4 lb

## 2018-06-01 DIAGNOSIS — I10 Essential (primary) hypertension: Secondary | ICD-10-CM

## 2018-06-01 MED ORDER — LISINOPRIL-HYDROCHLOROTHIAZIDE 10-12.5 MG PO TABS: 2.0000 | ORAL_TABLET | Freq: Every day | ORAL | 5 refills | Status: DC

## 2018-06-01 NOTE — Progress Notes (Signed)
   Subjective:    Patient ID: [name], female    DOB: 1970-12-12, 48 y.o.   MRN: [id number]  Hypertension  This is a chronic problem. There are no compliance problems.    Pt is taking Lisinopril-HCTZ 10-12.5 mg two tablets daily. Pt states she is doing well   Blood pressure medicine and blood pressure levels reviewed today with patient. Compliant with blood pressure medicine. States does not miss a dose. No obvious side effects. Blood pressure generally good when checked elsewhere. Watching salt intake.  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 Compliant with blood pressure medicine. States does not miss a dose. No obvious side effects. Blood pressure generally good when checked elsewhere. Watching salt intake.        Assessment & Plan:  Impression hypertension.  Good control discussed maintain same meds  Follow-up in 6 months exercise rest medications refilled of note participates in a chronic pain clinic.

## 2018-12-02 ENCOUNTER — Ambulatory Visit: Payer: BC Managed Care – PPO | Admitting: Family Medicine

## 2018-12-09 ENCOUNTER — Ambulatory Visit (INDEPENDENT_AMBULATORY_CARE_PROVIDER_SITE_OTHER): Payer: BC Managed Care – PPO | Admitting: Nurse Practitioner

## 2018-12-09 ENCOUNTER — Other Ambulatory Visit: Payer: Self-pay

## 2018-12-09 ENCOUNTER — Encounter: Payer: Self-pay | Admitting: Nurse Practitioner

## 2018-12-09 DIAGNOSIS — T402X5A Adverse effect of other opioids, initial encounter: Secondary | ICD-10-CM

## 2018-12-09 DIAGNOSIS — K5903 Drug induced constipation: Secondary | ICD-10-CM

## 2018-12-09 DIAGNOSIS — I10 Essential (primary) hypertension: Secondary | ICD-10-CM

## 2018-12-09 MED ORDER — LUBIPROSTONE 24 MCG PO CAPS: 24.0000 ug | ORAL_CAPSULE | Freq: Two times a day (BID) | ORAL | 2 refills | Status: DC

## 2018-12-09 MED ORDER — LISINOPRIL-HYDROCHLOROTHIAZIDE 20-25 MG PO TABS: 1.0000 | ORAL_TABLET | Freq: Every day | ORAL | 1 refills | Status: DC

## 2018-12-09 NOTE — Progress Notes (Signed)
  PHONE VISIT Subjective:    Patient ID: [name], female    DOB: [birth year], 48 y.o.   MRN: [id number]  Hypertension This is a chronic problem. Treatments tried: zestorectic.   Concerns about constipation. Taking miralax and was told at pain clinic to ask pcp about taking Linzess. Pt states bp is checked at clinic and it has been good.   Has physical end of this month and she states she has yearly bw that she will be doing with pain clinic and they will send it over.   Virtual Visit via Telephone Note  I connected with [name] on 12/09/18 at 10:40 AM EDT by telephone and verified that I am speaking with the correct person using two identifiers.  Location: Patient: home Provider: office   I discussed the limitations, risks, security and privacy concerns of performing an evaluation and management service by telephone and the availability of in person appointments. I also discussed with the patient that there may be a patient responsible charge related to this service. The patient expressed understanding and agreed to proceed.   History of Present Illness: See above. Presents for recheck of HTN. BP at Methadone clinic have been good (130s/70s). No CP/ischemic type pain, SOB or edema. No TIA symptomatology. Continues regular follow up with Methadone clinic. Will have labs done there and bring to office. Having continued constipation due to methadone despite daily Miralax. Would like prescription to help with this.    Observations/Objective: Today's visit was via telephone Physical exam was not possible for this visit Alert, oriented. Cheerful affect.   Assessment and Plan: Problem List Items Addressed This Visit      Cardiovascular and Mediastinum   Hypertension - Primary     Digestive   Constipation due to opioid therapy       Follow Up Instructions:    Meds ordered this encounter  Medications  . lisinopril-hydrochlorothiazide (ZESTORETIC) 20-25 MG tablet   Sig: Take 1 tablet by mouth daily.    Dispense:  90 tablet    Refill:  1    Order Specific Question:   Supervising Provider    Answer:   Sallee Lange A [[id number]]  . lubiprostone ([name]) 24 MCG capsule    Sig: Take 1 capsule (24 mcg total) by mouth 2 (two) times daily with a meal. For constipation    Dispense:  60 capsule    Refill:  2    Order Specific Question:   Supervising Provider    Answer:   Sallee Lange A [[id number]]   Trial of [name] for constipation. Call back if no improvement. Continue BP med at current dose. Recommend wellness exam. To bring labs at that time.  I discussed the assessment and treatment plan with the patient. The patient was provided an opportunity to ask questions and all were answered. The patient agreed with the plan and demonstrated an understanding of the instructions.   The patient was advised to call back or seek an in-person evaluation if the symptoms worsen or if the condition fails to improve as anticipated.  I provided 15 minutes of non-face-to-face time during this encounter.      Review of Systems     Objective:   Physical Exam        Assessment & Plan:

## 2019-01-10 ENCOUNTER — Telehealth: Payer: Self-pay | Admitting: Family Medicine

## 2019-01-10 NOTE — Telephone Encounter (Signed)
Please advise. Thank you

## 2019-01-10 NOTE — Telephone Encounter (Signed)
Patient said that she failed a drug screen at the Methadone clinic and thinks it could be from the medication that [facility] prescribed recently, lubiprostone ([name]) 24 MCG capsule. She said the nurse at the clinic said this could cause a false positive and she needs to know for sure since they are going to cut off her methadone because she failed.

## 2019-01-12 NOTE — Telephone Encounter (Signed)
Pt called back to state she figured out what happened  States she had eaten an "everything" bagel & the poppy seeds on the bagel messed up her drug screen   Please advise & call pt [phone]

## 2019-01-12 NOTE — Telephone Encounter (Signed)
First of all, I could find nothing that says amitiza causes pos drug screens. Secondly, she need to discuss her popy seed intake with her pain specialist and the clinician that ordered the urine screen, I have zero input on this

## 2019-01-12 NOTE — Telephone Encounter (Signed)
Pt contacted and states that she contacted the clinic and clarified everything with them. Pt also called pharmacist and asked about [name] also.

## 2019-01-12 NOTE — Telephone Encounter (Signed)
Please advise. Thank you

## 2019-01-19 ENCOUNTER — Other Ambulatory Visit: Payer: Self-pay | Admitting: *Deleted

## 2019-01-19 ENCOUNTER — Telehealth: Payer: Self-pay | Admitting: Family Medicine

## 2019-01-19 MED ORDER — PREDNISONE 20 MG PO TABS: ORAL_TABLET | ORAL | 0 refills | Status: DC

## 2019-01-19 NOTE — Telephone Encounter (Signed)
Med sent to pharm. Left message to return call  

## 2019-01-19 NOTE — Telephone Encounter (Signed)
Patient states has poison oak in the counter of her right-eye and her wrist to the bend of her elbow on right arm. Can you call in something to [facility]

## 2019-01-19 NOTE — Telephone Encounter (Signed)
Pt returned call and verbalized understanding  

## 2019-01-19 NOTE — Telephone Encounter (Signed)
Adult pred taper 

## 2019-03-27 ENCOUNTER — Telehealth: Payer: Self-pay | Admitting: Family Medicine

## 2019-03-27 NOTE — Telephone Encounter (Signed)
Patient calling in to let [name] know that the [name] has not worked at all.  [name]

## 2019-03-27 NOTE — Telephone Encounter (Signed)
Left message to return call 

## 2019-03-28 NOTE — Telephone Encounter (Signed)
Patient states she was given med for constipation but was still only going once a week even with 2 pills- only thing that helped was Miralax. Patient would like to try something else to see if it would work better

## 2019-03-29 NOTE — Telephone Encounter (Signed)
Please have her check with her insurance to see if they will cover Linzess or Trulance. Thanks.

## 2019-03-29 NOTE — Telephone Encounter (Signed)
Patient stated she will call insurance and see what they cover and back to let us know

## 2019-05-03 ENCOUNTER — Encounter: Payer: Self-pay | Admitting: Family Medicine

## 2019-05-05 ENCOUNTER — Telehealth: Payer: Self-pay | Admitting: Nurse Practitioner

## 2019-05-05 NOTE — Telephone Encounter (Signed)
Please advise. Thank you

## 2019-05-05 NOTE — Telephone Encounter (Signed)
Patient is requesting prescription linzess even though her insurance wont pay for it.She states spoke with you on this already. She uses [facility]

## 2019-05-07 NOTE — Telephone Encounter (Signed)
Stop amitiza, start lizness 145 mcg one daily six mo worth

## 2019-05-08 MED ORDER — LINACLOTIDE 145 MCG PO CAPS: 145.0000 ug | ORAL_CAPSULE | Freq: Every day | ORAL | 5 refills | Status: DC

## 2019-05-08 NOTE — Telephone Encounter (Signed)
Prescription sent electronically to pharmacy. Left message to return call to notify patient. 

## 2019-05-09 NOTE — Telephone Encounter (Signed)
Patient notified and verbalized understanding. 

## 2019-05-25 ENCOUNTER — Ambulatory Visit: Payer: BC Managed Care – PPO | Attending: Family

## 2019-05-25 DIAGNOSIS — Z23 Encounter for immunization: Secondary | ICD-10-CM

## 2019-05-25 NOTE — Progress Notes (Signed)
   Covid-19 Vaccination Clinic  Name:  [name]    MRN: [id number] DOB: [birth year]  05/25/2019  [name] was observed post Covid-19 immunization for 15 minutes without incidence. She was provided with Vaccine Information Sheet and instruction to access the V-Safe system.   [name] was instructed to call 911 with any severe reactions post vaccine: Marland Kitchen Difficulty breathing  . Swelling of your face and throat  . A fast heartbeat  . A bad rash all over your body  . Dizziness and weakness    Immunizations Administered    Name Date Dose VIS Date Route   Moderna COVID-19 Vaccine 05/25/2019 12:58 PM 0.5 mL 02/28/2019 Intramuscular   Manufacturer: Moderna   Lot: [id number]   NDC: 33007-622-63

## 2019-06-15 ENCOUNTER — Other Ambulatory Visit: Payer: Self-pay | Admitting: Nurse Practitioner

## 2019-06-27 ENCOUNTER — Ambulatory Visit: Payer: BC Managed Care – PPO | Attending: Family

## 2019-06-27 DIAGNOSIS — Z23 Encounter for immunization: Secondary | ICD-10-CM

## 2019-06-27 NOTE — Progress Notes (Signed)
   Covid-19 Vaccination Clinic  Name:  [name]    MRN: [id number] DOB: [birth year]  06/27/2019  [name] was observed post Covid-19 immunization for 15 minutes without incident. She was provided with Vaccine Information Sheet and instruction to access the V-Safe system.   [name] was instructed to call 911 with any severe reactions post vaccine: Marland Kitchen Difficulty breathing  . Swelling of face and throat  . A fast heartbeat  . A bad rash all over body  . Dizziness and weakness   Immunizations Administered    Name Date Dose VIS Date Route   Moderna COVID-19 Vaccine 06/27/2019  9:13 AM 0.5 mL 02/28/2019 Intramuscular   Manufacturer: Moderna   Lot: [id number]   NDC: 94712-527-12

## 2019-09-15 ENCOUNTER — Other Ambulatory Visit: Payer: Self-pay | Admitting: Family Medicine

## 2019-09-15 ENCOUNTER — Telehealth: Payer: Self-pay | Admitting: Nurse Practitioner

## 2019-09-15 NOTE — Telephone Encounter (Signed)
Last med check up 12/09/18

## 2019-09-18 NOTE — Telephone Encounter (Signed)
lvm to schedule appt.  

## 2019-09-19 NOTE — Telephone Encounter (Signed)
lvm to schedule appt.  

## 2019-09-20 NOTE — Telephone Encounter (Signed)
Scheduled 7/2

## 2019-09-29 ENCOUNTER — Ambulatory Visit (INDEPENDENT_AMBULATORY_CARE_PROVIDER_SITE_OTHER): Payer: BC Managed Care – PPO | Admitting: Nurse Practitioner

## 2019-09-29 ENCOUNTER — Other Ambulatory Visit: Payer: Self-pay

## 2019-09-29 ENCOUNTER — Encounter: Payer: Self-pay | Admitting: Nurse Practitioner

## 2019-09-29 VITALS — BP 118/68 | Temp 97.3°F | Ht 60.0 in | Wt 138.6 lb

## 2019-09-29 DIAGNOSIS — I10 Essential (primary) hypertension: Secondary | ICD-10-CM

## 2019-09-29 MED ORDER — LISINOPRIL-HYDROCHLOROTHIAZIDE 20-25 MG PO TABS: 1.0000 | ORAL_TABLET | Freq: Every day | ORAL | 1 refills | Status: DC

## 2019-09-29 NOTE — Progress Notes (Signed)
   Subjective:    Patient ID: [name], female    DOB: 1970-10-24, 48 y.o.   MRN: [id number]  Hypertension This is a chronic problem. The current episode started more than 1 year ago. Treatments tried: zestoretic. There are no compliance problems.    Presents for routine follow-up of her hypertension.  Adherent to medication regimen.  Staying active.  Rarely checks BP outside the office but all results have been normal.  Is due for her routine eye exam.  No chest pain/ischemic type pain or shortness of breath.  Has routine labs done through the methadone clinic each September.  No copy of last year's labs available during visit.  Has had both doses of her Covid vaccine.  No recent wellness exam.  Remains in recovery with routine follow-up, currently on methadone.   Review of Systems     Objective:   Physical Exam NAD.  Alert, oriented.  Cheerful, mildly anxious affect.  Lungs clear.  Heart regular rate rhythm.  No murmur noted.  Carotids no bruits or thrills.  Lower extremities no edema. Today's Vitals   09/29/19 1506  BP: 118/68  Temp: (!) 97.3 F (36.3 C)  TempSrc: Oral  Weight: 138 lb 9.6 oz (62.9 kg)  Height: 5' (1.524 m)   Body mass index is 27.07 kg/m.        Assessment & Plan:   Problem List Items Addressed This Visit      Cardiovascular and Mediastinum   Hypertension - Primary   Relevant Medications   lisinopril-hydrochlorothiazide (ZESTORETIC) 20-25 MG tablet     Meds ordered this encounter  Medications  . lisinopril-hydrochlorothiazide (ZESTORETIC) 20-25 MG tablet    Sig: Take 1 tablet by mouth daily.    Dispense:  90 tablet    Refill:  1    Order Specific Question:   Supervising Provider    Answer:   Lilyan Punt A [[id number]]   Continue healthy lifestyle habits.  Recommend wellness exam sometime this year including Pap smear and discussion of preventive health for her age. Return in about 6 months (around 03/31/2020).

## 2019-10-01 ENCOUNTER — Encounter: Payer: Self-pay | Admitting: Nurse Practitioner

## 2020-01-02 ENCOUNTER — Other Ambulatory Visit: Payer: BC Managed Care – PPO

## 2020-01-02 ENCOUNTER — Other Ambulatory Visit: Payer: Self-pay | Admitting: *Deleted

## 2020-01-02 DIAGNOSIS — Z20822 Contact with and (suspected) exposure to covid-19: Secondary | ICD-10-CM

## 2020-01-03 LAB — SPECIMEN STATUS REPORT

## 2020-01-03 LAB — SARS-COV-2, NAA 2 DAY TAT

## 2020-01-03 LAB — NOVEL CORONAVIRUS, NAA: SARS-CoV-2, NAA: NOT DETECTED

## 2020-04-04 ENCOUNTER — Ambulatory Visit: Payer: Self-pay | Attending: Family

## 2020-04-04 ENCOUNTER — Other Ambulatory Visit: Payer: BC Managed Care – PPO

## 2020-04-04 DIAGNOSIS — Z23 Encounter for immunization: Secondary | ICD-10-CM

## 2020-04-09 ENCOUNTER — Other Ambulatory Visit: Payer: Self-pay | Admitting: Nurse Practitioner

## 2020-04-12 NOTE — Telephone Encounter (Signed)
Needs routine follow up on HTN. Thanks. Will send in RF.

## 2020-07-22 ENCOUNTER — Other Ambulatory Visit: Payer: Self-pay | Admitting: Nurse Practitioner

## 2020-07-31 NOTE — Progress Notes (Signed)
   Covid-19 Vaccination [facility]  Name:  [name]    MRN: [id number] DOB: 1970-08-22  07/31/2020  [name] was observed post Covid-19 immunization for 15 minutes without incident. She was provided with Vaccine Information Sheet and instruction to access the V-Safe system.   [name] was instructed to call 911 with any severe reactions post vaccine: Marland Kitchen Difficulty breathing  . Swelling of face and throat  . A fast heartbeat  . A bad rash all over body  . Dizziness and weakness   Immunizations Administered    Name Date Dose VIS Date Route   Moderna Covid-19 Booster Vaccine 04/04/2020 10:30 AM 0.25 mL 01/17/2020 Intramuscular   Manufacturer: Moderna   Lot: [id number]   NDC: 02774-128-78

## 2020-10-29 ENCOUNTER — Telehealth: Payer: Self-pay | Admitting: Nurse Practitioner

## 2020-10-30 NOTE — Telephone Encounter (Signed)
If possible, would like to see her this month for recheck on her BP. I will send in a refill but want her to have more refills during our transition time. She was last seen for BP July 2021. Thanks.

## 2020-10-31 NOTE — Telephone Encounter (Signed)
Please contact patient to have her set up appt. Thank you 

## 2020-11-05 NOTE — Telephone Encounter (Signed)
Sent mychart message

## 2020-12-05 NOTE — Telephone Encounter (Signed)
Called and sent my chart message on 8/9 and no response

## 2021-01-26 ENCOUNTER — Other Ambulatory Visit: Payer: Self-pay | Admitting: Nurse Practitioner

## 2021-04-29 ENCOUNTER — Other Ambulatory Visit: Payer: Self-pay | Admitting: Nurse Practitioner

## 2021-04-29 NOTE — Telephone Encounter (Signed)
Sent my chart message 04/29/21 °

## 2021-04-30 NOTE — Telephone Encounter (Signed)
Phone is disconnected 04/30/21

## 2021-05-01 NOTE — Telephone Encounter (Signed)
No way of getting in touch with patient.2/2/223

## 2021-05-06 ENCOUNTER — Encounter: Payer: Self-pay | Admitting: Nurse Practitioner

## 2021-05-06 ENCOUNTER — Ambulatory Visit (HOSPITAL_COMMUNITY)
Admission: RE | Admit: 2021-05-06 | Discharge: 2021-05-06 | Disposition: A | Discharge status: Home / Self Care | Payer: BC Managed Care – PPO | Source: Ambulatory Visit | Attending: Nurse Practitioner | Admitting: Nurse Practitioner

## 2021-05-06 ENCOUNTER — Ambulatory Visit: Payer: BC Managed Care – PPO | Admitting: Nurse Practitioner

## 2021-05-06 ENCOUNTER — Other Ambulatory Visit: Payer: Self-pay

## 2021-05-06 VITALS — BP 152/86 | Temp 97.3°F | Wt 151.8 lb

## 2021-05-06 DIAGNOSIS — I10 Essential (primary) hypertension: Secondary | ICD-10-CM | POA: Diagnosis not present

## 2021-05-06 DIAGNOSIS — F411 Generalized anxiety disorder: Secondary | ICD-10-CM

## 2021-05-06 DIAGNOSIS — K5900 Constipation, unspecified: Secondary | ICD-10-CM | POA: Diagnosis not present

## 2021-05-06 DIAGNOSIS — Z7689 Persons encountering health services in other specified circumstances: Secondary | ICD-10-CM

## 2021-05-06 MED ORDER — LISINOPRIL-HYDROCHLOROTHIAZIDE 20-25 MG PO TABS: 1.0000 | ORAL_TABLET | Freq: Every day | ORAL | 1 refills | Status: DC

## 2021-05-06 MED ORDER — LINACLOTIDE 145 MCG PO CAPS: 145.0000 ug | ORAL_CAPSULE | Freq: Every day | ORAL | 1 refills | Status: DC

## 2021-05-06 NOTE — Progress Notes (Signed)
Subjective:    Patient ID: [name], female    DOB: 17-Aug-1970, 51 y.o.   MRN: [id number]  HPI Patient here to establish care and for refills on Linzess and lisinopril hydrochlorothiazide.  Patient also has complaints of constipation, vomiting, nausea x1 day.  Patient has history of chronic constipation.  Patient states that she had taken MiraLAX 2 days ago.  However, yesterday night she experienced abdominal pain, heartburn, vomiting yellow fluid, sweating, and nausea.  Patient states that she had a bowel movement this morning however she still feels nauseous today.  Patient denies any vomiting today.  Patient states that she is still able to pass gas without difficulty.  Patient denies blood in stool blood in vomit, fever, body aches, chills.    Review of Systems  Gastrointestinal:  Positive for abdominal distention, abdominal pain, constipation and nausea.      Objective:   Physical Exam Constitutional:      General: She is not in acute distress.    Appearance: Normal appearance. She is normal weight. She is not ill-appearing or toxic-appearing.  HENT:     Head: Normocephalic.  Cardiovascular:     Rate and Rhythm: Normal rate and regular rhythm.     Pulses: Normal pulses.     Heart sounds: Normal heart sounds. No murmur heard. Pulmonary:     Effort: Pulmonary effort is normal. No respiratory distress.     Breath sounds: Normal breath sounds. No wheezing.  Abdominal:     General: Abdomen is flat. Bowel sounds are normal. There is distension.     Palpations: Abdomen is soft. There is no shifting dullness, fluid wave, hepatomegaly, splenomegaly, mass or pulsatile mass.     Tenderness: There is abdominal tenderness in the left lower quadrant. There is no guarding or rebound. Negative signs include Murphy's sign, Rovsing's sign, McBurney's sign, psoas sign and obturator sign.     Hernia: No hernia is present.  Musculoskeletal:        General: Normal range of motion.  Skin:     General: Skin is warm.     Capillary Refill: Capillary refill takes less than 2 seconds.  Neurological:     General: No focal deficit present.     Mental Status: She is alert and oriented to person, place, and time.  Psychiatric:        Mood and Affect: Mood normal.        Behavior: Behavior normal.          Assessment & Plan:   1. Encounter to establish care - Patient established care - Follow up in 3 months for follow up on constipation and blood pressure  2. Constipation, unspecified constipation type -Likely related to IBS.  Patient is passing stool so blockage is not as likely, however we will get a KUB to rule out intestinal blockage prior to restarting Linzess. - linaclotide (LINZESS) 145 MCG CAPS capsule; Take 1 capsule (145 mcg total) by mouth daily before breakfast.  Dispense: 90 capsule; Refill: 1 - DG Abd 1 View - CBC with Differential - Amylase - Lipase -Follow-up in 3 months or sooner pending exam results  3. Essential hypertension -Blood pressure today 152/86.  Goal blood pressure 140/90. -Patient states she has not taken her blood pressure medication today because she has been out. -Blood pressure medication refilled today -Take blood pressure medication as prescribed. -Will get lipid and A1c to assess cardiovascular risk. - CMP14+EGFR - Lipid Panel With LDL/HDL Ratio - HgB A1c -  lisinopril-hydrochlorothiazide (ZESTORETIC) 20-25 MG tablet; Take 1 tablet by mouth daily.  Dispense: 90 tablet; Refill: 1 -Follow-up in 3 months  4. Anxiety -Discussed link between IBS and anxiety in detail -Patient has therapist -Encouraged patient to discuss cognitive behavioral therapy with therapist -Also encourage patient to review the feeling good book by Dr. Judie Petit.

## 2021-05-06 NOTE — Progress Notes (Signed)
Hello [name]!  Your scan looks good.  No blockages were noted.  Did see some stool however that was expected.  You may go and pick up your Linzess.  I look forward to seeing you in 3 months or sooner if you need me.  I hope you feel better soon.

## 2021-05-06 NOTE — Patient Instructions (Signed)
Please ask you therapist if he/she does "Cognitive Behavioral Therapy".  "Feeling Good" book by Dr. Judie Petit

## 2021-05-07 LAB — HEMOGLOBIN A1C
Est. average glucose Bld gHb Est-mCnc: 120 mg/dL
Hgb A1c MFr Bld: 5.8 % — ABNORMAL HIGH (ref 4.8–5.6)

## 2021-05-07 LAB — CMP14+EGFR
ALT: 24 IU/L (ref 0–32)
AST: 22 IU/L (ref 0–40)
Albumin/Globulin Ratio: 2 (ref 1.2–2.2)
Albumin: 4.9 g/dL (ref 3.8–4.9)
Alkaline Phosphatase: 130 IU/L — ABNORMAL HIGH (ref 44–121)
BUN/Creatinine Ratio: 25 — ABNORMAL HIGH (ref 9–23)
BUN: 25 mg/dL — ABNORMAL HIGH (ref 6–24)
Bilirubin Total: <0.2 mg/dL (ref 0.0–1.2)
CO2: 25 mmol/L (ref 20–29)
Calcium: 10.3 mg/dL — ABNORMAL HIGH (ref 8.7–10.2)
Chloride: 98 mmol/L (ref 96–106)
Creatinine, Ser: 1 mg/dL (ref 0.57–1.00)
Globulin, Total: 2.5 g/dL (ref 1.5–4.5)
Glucose: 91 mg/dL (ref 70–99)
Potassium: 4.3 mmol/L (ref 3.5–5.2)
Sodium: 137 mmol/L (ref 134–144)
Total Protein: 7.4 g/dL (ref 6.0–8.5)
eGFR: 68 mL/min/{1.73_m2} (ref >59)

## 2021-05-07 LAB — LIPID PANEL WITH LDL/HDL RATIO
Cholesterol, Total: 225 mg/dL — ABNORMAL HIGH (ref 100–199)
HDL: 52 mg/dL (ref >39)
LDL Chol Calc (NIH): 127 mg/dL — ABNORMAL HIGH (ref 0–99)
LDL/HDL Ratio: 2.4 ratio (ref 0.0–3.2)
Triglycerides: 258 mg/dL — ABNORMAL HIGH (ref 0–149)
VLDL Cholesterol Cal: 46 mg/dL — ABNORMAL HIGH (ref 5–40)

## 2021-05-07 LAB — CBC WITH DIFFERENTIAL/PLATELET
Basophils Absolute: 0 10*3/uL (ref 0.0–0.2)
Basos: 0 %
EOS (ABSOLUTE): 0.2 10*3/uL (ref 0.0–0.4)
Eos: 2 %
Hematocrit: 38.4 % (ref 34.0–46.6)
Hemoglobin: 13.1 g/dL (ref 11.1–15.9)
Immature Grans (Abs): 0 10*3/uL (ref 0.0–0.1)
Immature Granulocytes: 0 %
Lymphocytes Absolute: 2.2 10*3/uL (ref 0.7–3.1)
Lymphs: 28 %
MCH: 30.5 pg (ref 26.6–33.0)
MCHC: 34.1 g/dL (ref 31.5–35.7)
MCV: 89 fL (ref 79–97)
Monocytes Absolute: 0.8 10*3/uL (ref 0.1–0.9)
Monocytes: 10 %
Neutrophils Absolute: 4.7 10*3/uL (ref 1.4–7.0)
Neutrophils: 60 %
Platelets: 249 10*3/uL (ref 150–450)
RBC: 4.3 x10E6/uL (ref 3.77–5.28)
RDW: 12.3 % (ref 11.7–15.4)
WBC: 7.8 10*3/uL (ref 3.4–10.8)

## 2021-05-07 LAB — SPECIMEN STATUS REPORT

## 2021-05-08 ENCOUNTER — Other Ambulatory Visit: Payer: Self-pay | Admitting: Nurse Practitioner

## 2021-05-08 DIAGNOSIS — E782 Mixed hyperlipidemia: Secondary | ICD-10-CM | POA: Insufficient documentation

## 2021-05-08 LAB — LIPASE: Lipase: 51 U/L (ref 14–72)

## 2021-05-08 LAB — AMYLASE: Amylase: 83 U/L (ref 31–110)

## 2021-05-08 LAB — SPECIMEN STATUS REPORT

## 2021-05-08 MED ORDER — ROSUVASTATIN CALCIUM 5 MG PO TABS: 5.0000 mg | ORAL_TABLET | Freq: Every day | ORAL | 3 refills | Status: DC

## 2021-05-08 NOTE — Progress Notes (Signed)
Nurses please call patient with the following information:  I had a chance to review your labs.  Some minor variations noted to your chemistry panel none of which are concerning at this time we will look at them again in about 6 weeks.  Your cholesterol levels are rather elevated and it is recommended that you start medication.  I will send in a statin for you to your preferred pharmacy.  I will be sending in rosuvastatin 5 mg.  Statins are known to decrease your risk of cardiovascular events.  I will need to see you back in 6 weeks to see how you are doing on the medication and to recheck your lab work.  Your A1c is in prediabetic range.  The when we addressed this is through diet and exercise.  Try to eat a well-balanced diet with plenty of lean meat and vegetables.  Exercise for 30 minutes a day 3-5 times a week.  Your lipase and amylase were normal I have no concerns about your pancreas at this time.  Your white blood count looks good no concerns of infection at this time.  Please make an appointment to see me in 6 weeks.

## 2021-05-13 ENCOUNTER — Ambulatory Visit: Payer: BC Managed Care – PPO | Admitting: Nurse Practitioner

## 2021-08-04 ENCOUNTER — Ambulatory Visit: Payer: Self-pay | Admitting: Nurse Practitioner

## 2021-10-22 ENCOUNTER — Ambulatory Visit: Payer: BC Managed Care – PPO | Admitting: Family Medicine

## 2021-10-22 DIAGNOSIS — H1032 Unspecified acute conjunctivitis, left eye: Secondary | ICD-10-CM

## 2021-10-22 DIAGNOSIS — I10 Essential (primary) hypertension: Secondary | ICD-10-CM

## 2021-10-22 DIAGNOSIS — K5903 Drug induced constipation: Secondary | ICD-10-CM

## 2021-10-22 DIAGNOSIS — H109 Unspecified conjunctivitis: Secondary | ICD-10-CM | POA: Insufficient documentation

## 2021-10-22 DIAGNOSIS — T402X5A Adverse effect of other opioids, initial encounter: Secondary | ICD-10-CM | POA: Diagnosis not present

## 2021-10-22 MED ORDER — LISINOPRIL-HYDROCHLOROTHIAZIDE 20-25 MG PO TABS: 1.0000 | ORAL_TABLET | Freq: Every day | ORAL | 1 refills | Status: DC

## 2021-10-22 MED ORDER — LINACLOTIDE 145 MCG PO CAPS: 145.0000 ug | ORAL_CAPSULE | Freq: Every day | ORAL | 1 refills | Status: DC

## 2021-10-22 MED ORDER — MOXIFLOXACIN HCL 0.5 % OP SOLN: 1.0000 [drp] | Freq: Three times a day (TID) | OPHTHALMIC | 0 refills | Status: DC

## 2021-10-22 NOTE — Assessment & Plan Note (Signed)
Stable.  Continue Linzess.

## 2021-10-22 NOTE — Assessment & Plan Note (Signed)
Treating with Vigamox. °

## 2021-10-22 NOTE — Progress Notes (Signed)
Subjective:  Patient ID: [name], female    DOB: 09-Sep-1970  Age: 51 y.o. MRN: [id number]  CC: Chief Complaint  Patient presents with   Conjunctivitis    Left eye. Watery, itchy, red and matted together in morning.   chest congestion    Coughing up green mucus.     HPI:  51 year old female with a history of substance abuse currently on methadone which she is tapering down, constipation, hypertension presents for evaluation of the above.  Patient reports that she has recently had some respiratory symptoms.  She has had a productive cough.  She states that she feels like she has had a cold.  Yesterday she developed left eye redness and woke up this morning with some crusting.  Had to use a warm compress to open up her eye.  Patient believes that she has conjunctivitis.  No other associated symptoms.   Patient's constipation is stable.  Needs refill on Linzess.  Hypertension is at goal.  Needs refill on lisinopril/HCTZ.  Patient Active Problem List   Diagnosis Date Noted   Conjunctivitis 10/22/2021   Mixed hyperlipidemia 05/08/2021   Generalized anxiety disorder 05/06/2021   Fibromyalgia 11/22/2015   Constipation due to opioid therapy 11/22/2015   History of substance abuse (HCC) 10/18/2012   Essential hypertension 07/17/2012    Social Hx   Social History   Socioeconomic History   Marital status: Legally Separated    Spouse name: Not on file   Number of children: Not on file   Years of education: Not on file   Highest education level: Not on file  Occupational History   Not on file  Tobacco Use   Smoking status: Some Days    Packs/day: 0.25    Years: 20.00    Total pack years: 5.00    Types: Pipe, Cigarettes   Smokeless tobacco: Never   Tobacco comments:    Patient has recently statred back smoking-occassionally  Substance and Sexual Activity   Alcohol use: No    Comment: history of alcohol abuse-last used 3 months ago 11/2014   Drug use: No    Comment:  recovered addict; history of substance abuse-last used 3 months ago-presciption drugs-11/2014   Sexual activity: Yes    Birth control/protection: Surgical    Comment: partner had vasectomy  Other Topics Concern   Not on file  Social History Narrative   Not on file   Social Determinants of Health   Financial Resource Strain: Not on file  Food Insecurity: Not on file  Transportation Needs: Not on file  Physical Activity: Not on file  Stress: Not on file  Social Connections: Not on file    Review of Systems Per HPI  Objective:  BP 113/71   Pulse 66   Temp 97.8 F (36.6 C) (Oral)   Ht 5' (1.524 m)   Wt 140 lb 3.2 oz (63.6 kg)   SpO2 99%   BMI 27.38 kg/m      10/22/2021    1:57 PM 05/06/2021    2:47 PM 05/06/2021    1:22 PM  BP/Weight  Systolic BP 113 152 150  Diastolic BP 71 86 88  Wt. (Lbs) 140.2  151.8  BMI 27.38 kg/m2  29.65 kg/m2    Physical Exam Vitals and nursing note reviewed.  Constitutional:      General: She is not in acute distress.    Appearance: Normal appearance.  HENT:     Head: Normocephalic and atraumatic.  Eyes:  General:        Right eye: No discharge.        Left eye: No discharge.     Conjunctiva/sclera: Conjunctivae normal.  Cardiovascular:     Rate and Rhythm: Normal rate and regular rhythm.  Pulmonary:     Effort: Pulmonary effort is normal.     Breath sounds: Normal breath sounds. No wheezing, rhonchi or rales.  Neurological:     Mental Status: She is alert.  Psychiatric:        Mood and Affect: Mood normal.        Behavior: Behavior normal.     Lab Results  Component Value Date   WBC 7.8 05/06/2021   HGB 13.1 05/06/2021   HCT 38.4 05/06/2021   PLT 249 05/06/2021   GLUCOSE 91 05/06/2021   CHOL 225 (H) 05/06/2021   TRIG 258 (H) 05/06/2021   HDL 52 05/06/2021   LDLCALC 127 (H) 05/06/2021   ALT 24 05/06/2021   AST 22 05/06/2021   NA 137 05/06/2021   K 4.3 05/06/2021   CL 98 05/06/2021   CREATININE 1.00 05/06/2021    BUN 25 (H) 05/06/2021   CO2 25 05/06/2021   TSH 1.129 07/22/2012   HGBA1C 5.8 (H) 05/06/2021     Assessment & Plan:   Problem List Items Addressed This Visit       Cardiovascular and Mediastinum   Essential hypertension    Well-controlled.  Lisinopril/HCTZ refilled.      Relevant Medications   lisinopril-hydrochlorothiazide (ZESTORETIC) 20-25 MG tablet     Digestive   Constipation due to opioid therapy    Stable.  Continue Linzess.      Relevant Medications   linaclotide (LINZESS) 145 MCG CAPS capsule     Other   Conjunctivitis    Treating with Vigamox.       Meds ordered this encounter  Medications   lisinopril-hydrochlorothiazide (ZESTORETIC) 20-25 MG tablet    Sig: Take 1 tablet by mouth daily.    Dispense:  90 tablet    Refill:  1    Needs office visit   linaclotide (LINZESS) 145 MCG CAPS capsule    Sig: Take 1 capsule (145 mcg total) by mouth daily before breakfast.    Dispense:  90 capsule    Refill:  1    (patient aware insurance does not cover)   moxifloxacin (VIGAMOX) 0.5 % ophthalmic solution    Sig: Place 1 drop into the left eye 3 (three) times daily. For 5 days.    Dispense:  3 mL    Refill:  0    Bilbo Carcamo DO [facility]

## 2021-10-22 NOTE — Assessment & Plan Note (Signed)
Well-controlled.  Lisinopril/HCTZ refilled.

## 2021-10-22 NOTE — Patient Instructions (Signed)
Eye drop as prescribed.  I have refilled your other medications.  Take care  Dr. Adriana Simas

## 2022-05-10 ENCOUNTER — Other Ambulatory Visit: Payer: Self-pay | Admitting: Family Medicine

## 2022-05-10 DIAGNOSIS — T402X5A Adverse effect of other opioids, initial encounter: Secondary | ICD-10-CM

## 2022-05-10 DIAGNOSIS — I10 Essential (primary) hypertension: Secondary | ICD-10-CM

## 2022-07-28 IMAGING — DX DG ABDOMEN 1V
2 series · 2 of 2 positions shown · non-contrast
Comparison: None.

CLINICAL DATA: Constipation.  Vomiting.  Nausea.

EXAM:
ABDOMEN - 1 VIEW

[abdomen kub (1 of 2)]
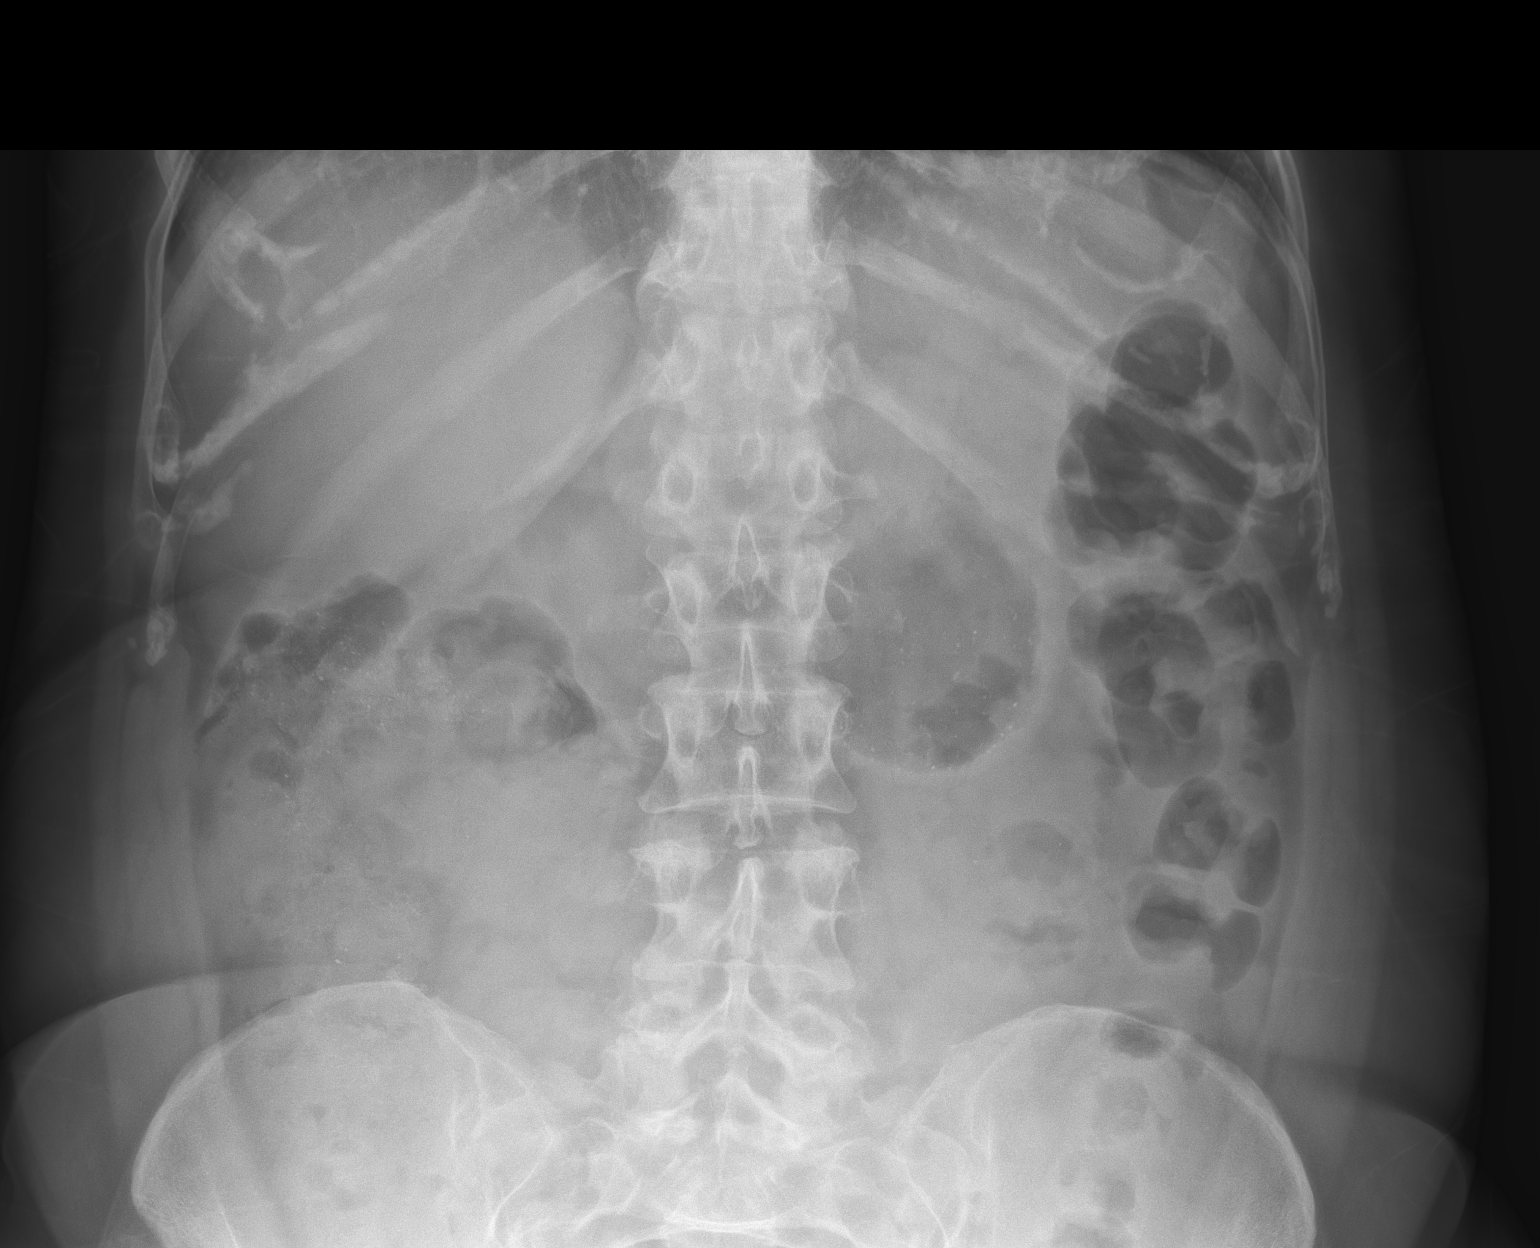

[abdomen kub (2 of 2)]
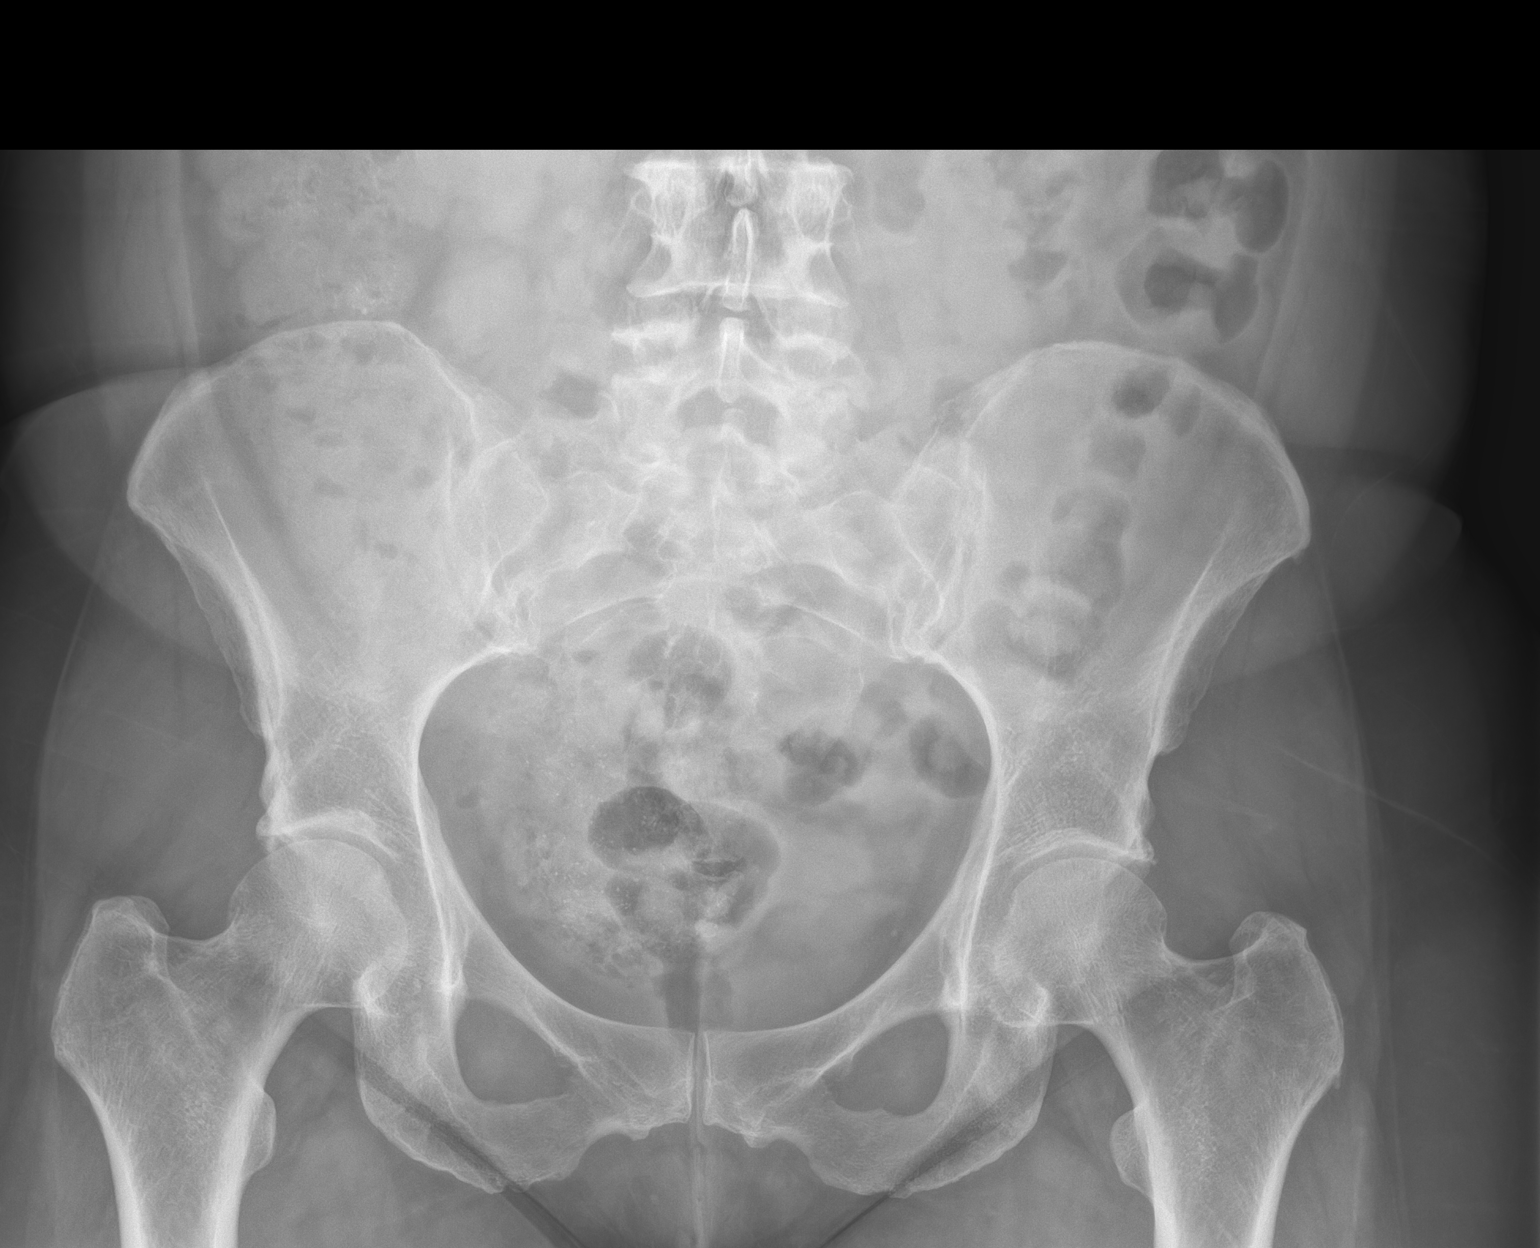

[2 of 2 positions shown; findings below may reference images not displayed]

FINDINGS: The bowel gas pattern is normal. No radio-opaque calculi or other
significant radiographic abnormality are seen.
IMPRESSION: Negative.

## 2022-08-25 ENCOUNTER — Other Ambulatory Visit: Payer: Self-pay | Admitting: Family Medicine

## 2022-08-25 DIAGNOSIS — T402X5A Adverse effect of other opioids, initial encounter: Secondary | ICD-10-CM

## 2022-08-25 DIAGNOSIS — I10 Essential (primary) hypertension: Secondary | ICD-10-CM

## 2022-08-27 NOTE — Telephone Encounter (Signed)
No longer patient  here

## 2023-04-22 ENCOUNTER — Ambulatory Visit: Payer: 59 | Admitting: Physician Assistant

## 2023-04-22 ENCOUNTER — Ambulatory Visit: Payer: Self-pay | Admitting: Family Medicine

## 2023-08-04 ENCOUNTER — Encounter: Payer: Self-pay | Admitting: Nurse Practitioner

## 2024-03-20 ENCOUNTER — Ambulatory Visit (INDEPENDENT_AMBULATORY_CARE_PROVIDER_SITE_OTHER): Admitting: Family Medicine

## 2024-03-20 ENCOUNTER — Encounter: Payer: Self-pay | Admitting: Family Medicine

## 2024-03-20 VITALS — BP 127/68 | HR 53 | Temp 97.7°F | Ht 60.0 in | Wt 126.2 lb

## 2024-03-20 DIAGNOSIS — F332 Major depressive disorder, recurrent severe without psychotic features: Secondary | ICD-10-CM | POA: Diagnosis not present

## 2024-03-20 DIAGNOSIS — I10 Essential (primary) hypertension: Secondary | ICD-10-CM | POA: Diagnosis not present

## 2024-03-20 MED ORDER — HYDROCHLOROTHIAZIDE 12.5 MG PO CAPS: 12.5000 mg | ORAL_CAPSULE | Freq: Every day | ORAL | 1 refills | Status: AC

## 2024-03-20 MED ORDER — TOPIRAMATE 50 MG PO TABS: 50.0000 mg | ORAL_TABLET | Freq: Two times a day (BID) | ORAL | 1 refills | Status: AC

## 2024-03-20 MED ORDER — ARIPIPRAZOLE 5 MG PO TABS: 5.0000 mg | ORAL_TABLET | Freq: Every day | ORAL | 1 refills | Status: AC

## 2024-03-20 MED ORDER — METOPROLOL TARTRATE 25 MG PO TABS: 12.5000 mg | ORAL_TABLET | Freq: Two times a day (BID) | ORAL | 1 refills | Status: AC

## 2024-03-20 MED ORDER — GABAPENTIN 300 MG PO CAPS: 300.0000 mg | ORAL_CAPSULE | Freq: Three times a day (TID) | ORAL | 1 refills | Status: AC

## 2024-03-20 MED ORDER — BUPROPION HCL ER (XL) 150 MG PO TB24: 150.0000 mg | ORAL_TABLET | Freq: Every day | ORAL | 3 refills | Status: AC

## 2024-03-20 MED ORDER — METHOCARBAMOL 750 MG PO TABS: 750.0000 mg | ORAL_TABLET | Freq: Three times a day (TID) | ORAL | 1 refills | Status: AC | PRN

## 2024-03-20 NOTE — Patient Instructions (Signed)
Follow up in 1 month.  Take care  Dr. Sanai Frick  

## 2024-03-21 DIAGNOSIS — F339 Major depressive disorder, recurrent, unspecified: Secondary | ICD-10-CM | POA: Insufficient documentation

## 2024-03-21 NOTE — Progress Notes (Signed)
 "  Subjective:  Patient ID: [name], female    DOB: 08-18-1970  Age: 53 y.o. MRN: [id number]  CC:   Chief Complaint  Patient presents with   Follow-up    Med refill  Wants to change Amlodipine due to swelling and find alternative to Abilify      HPI:  53 year old female presents for follow-up.  Patient has a history of substance abuse/addiction.  She states that she was recently in a treatment facility in [place] .  She also has anxiety and depression.  She was previously on lisinopril /HCTZ with well-controlled blood pressure.  This has been discontinued and she was placed on amlodipine at this treatment facility.  She states that she is experiencing lower extremity edema.  She would like to discuss change in therapy.  Patient is on multiple other medications here regarding mood and substance abuse.  She is on Topamax , Wellbutrin , gabapentin , Abilify .  Additionally, she is also on metoprolol , presumably for hypertension.  Patient states that she has had some recent tremors.  She states that the dose of Abilify  was decreased.  She would like to discuss her medications today.  Patient Active Problem List   Diagnosis Date Noted   Major depressive disorder, recurrent 03/21/2024   Mixed hyperlipidemia 05/08/2021   Generalized anxiety disorder 05/06/2021   Fibromyalgia 11/22/2015   History of substance abuse (HCC) 10/18/2012   Essential hypertension 07/17/2012    Social Hx   Social History   Socioeconomic History   Marital status: Legally Separated    Spouse name: Not on file   Number of children: Not on file   Years of education: Not on file   Highest education level: Not on file  Occupational History   Not on file  Tobacco Use   Smoking status: Some Days    Current packs/day: 0.25    Average packs/day: 0.3 packs/day for 20.0 years (5.0 ttl pk-yrs)    Types: Pipe, Cigarettes   Smokeless tobacco: Never   Tobacco comments:    Patient has recently statred back  smoking-occassionally  Substance and Sexual Activity   Alcohol use: No    Comment: history of alcohol abuse-last used 3 months ago 11/2014   Drug use: No    Comment: recovered addict; history of substance abuse-last used 3 months ago-presciption drugs-11/2014   Sexual activity: Yes    Birth control/protection: Surgical    Comment: partner had vasectomy  Other Topics Concern   Not on file  Social History Narrative   ** Merged History Encounter **       Social Drivers of Health   Tobacco Use: High Risk (08/04/2023)   Patient History    Smoking Tobacco Use: Some Days    Smokeless Tobacco Use: Never    Passive Exposure: Not on file  Financial Resource Strain: Not on file  Food Insecurity: Not on file  Transportation Needs: Not on file  Physical Activity: Not on file  Stress: Not on file  Social Connections: Not on file  Depression (PHQ2-9): High Risk (03/20/2024)   Depression (PHQ2-9)    PHQ-2 Score: 16  Alcohol Screen: Not on file  Housing: Not on file  Utilities: Not on file  Health Literacy: Not on file    Review of Systems Per HPI  Objective:  BP 127/68   Pulse (!) 53   Temp 97.7 F (36.5 C)   Ht 5' (1.524 m)   Wt 126 lb 3.2 oz (57.2 kg)   LMP  (Approximate)   SpO2 97%  BMI 24.65 kg/m      03/20/2024    3:05 PM 10/22/2021    1:57 PM 05/06/2021    2:47 PM  BP/Weight  Systolic BP 127 113 152  Diastolic BP 68 71 86  Wt. (Lbs) 126.2 140.2   BMI 24.65 kg/m2 27.38 kg/m2     Physical Exam Vitals and nursing note reviewed.  Constitutional:      General: She is not in acute distress.    Appearance: Normal appearance.  HENT:     Head: Normocephalic and atraumatic.  Eyes:     General:        Right eye: No discharge.        Left eye: No discharge.     Conjunctiva/sclera: Conjunctivae normal.  Cardiovascular:     Rate and Rhythm: Regular rhythm. Bradycardia present.  Pulmonary:     Effort: Pulmonary effort is normal.     Breath sounds: Normal breath  sounds. No wheezing, rhonchi or rales.  Neurological:     Mental Status: She is alert.  Psychiatric:     Comments: Flat affect.  Depressed mood.     Lab Results  Component Value Date   WBC 7.8 05/06/2021   HGB 13.1 05/06/2021   HCT 38.4 05/06/2021   PLT 249 05/06/2021   GLUCOSE 91 05/06/2021   CHOL 225 (H) 05/06/2021   TRIG 258 (H) 05/06/2021   HDL 52 05/06/2021   LDLCALC 127 (H) 05/06/2021   ALT 24 05/06/2021   AST 22 05/06/2021   NA 137 05/06/2021   K 4.3 05/06/2021   CL 98 05/06/2021   CREATININE 1.00 05/06/2021   BUN 25 (H) 05/06/2021   CO2 25 05/06/2021   TSH 1.129 07/22/2012   HGBA1C 5.8 (H) 05/06/2021     Assessment & Plan:  Severe episode of recurrent major depressive disorder, without psychotic features (HCC) Assessment & Plan: Meds were sent in.  Wellbutrin  dose was decreased.  Could be contributing to tremor.  Orders: -     ARIPiprazole ; Take 1 tablet (5 mg total) by mouth daily.  Dispense: 90 tablet; Refill: 1 -     buPROPion  HCl ER (XL); Take 1 tablet (150 mg total) by mouth daily.  Dispense: 90 tablet; Refill: 3  Essential hypertension Assessment & Plan: BP is stable but she is having side effects from treatment.  Stopping amlodipine.  Starting HCTZ.  Orders: -     Metoprolol  Tartrate; Take 0.5 tablets (12.5 mg total) by mouth 2 (two) times daily.  Dispense: 90 tablet; Refill: 1 -     hydroCHLOROthiazide ; Take 1 capsule (12.5 mg total) by mouth daily.  Dispense: 90 capsule; Refill: 1  Other orders -     Gabapentin ; Take 1 capsule (300 mg total) by mouth 3 (three) times daily.  Dispense: 270 capsule; Refill: 1 -     Topiramate ; Take 1 tablet (50 mg total) by mouth 2 (two) times daily.  Dispense: 180 tablet; Refill: 1 -     Methocarbamol ; Take 1 tablet (750 mg total) by mouth every 8 (eight) hours as needed for muscle spasms.  Dispense: 90 tablet; Refill: 1    Follow-up: 1 month  Damante Spragg DO [facility] "

## 2024-03-21 NOTE — Assessment & Plan Note (Signed)
 Meds were sent in.  Wellbutrin  dose was decreased.  Could be contributing to tremor.

## 2024-03-21 NOTE — Assessment & Plan Note (Signed)
 BP is stable but she is having side effects from treatment.  Stopping amlodipine.  Starting HCTZ.

## 2024-04-20 ENCOUNTER — Ambulatory Visit
